# Patient Record
Sex: Female | Born: 1970
Health system: Southern US, Community
[De-identification: ages and names within clinical notes are randomized; demographics above are authoritative.]

## PROBLEM LIST (undated history)

## (undated) DIAGNOSIS — Z9852 Vasectomy status: Secondary | ICD-10-CM

## (undated) DIAGNOSIS — N200 Calculus of kidney: Secondary | ICD-10-CM

## (undated) HISTORY — DX: Calculus of kidney: N20.0

## (undated) HISTORY — DX: Vasectomy status: Z98.52

---

## 1991-08-30 HISTORY — PX: WISDOM TOOTH EXTRACTION: SHX21

## 1999-02-16 ENCOUNTER — Other Ambulatory Visit: Admission: RE | Admit: 1999-02-16 | Discharge: 1999-02-16 | Payer: Self-pay | Admitting: Gynecology

## 2000-03-09 ENCOUNTER — Other Ambulatory Visit: Admission: RE | Admit: 2000-03-09 | Discharge: 2000-03-09 | Payer: Self-pay | Admitting: Obstetrics and Gynecology

## 2001-04-13 ENCOUNTER — Other Ambulatory Visit: Admission: RE | Admit: 2001-04-13 | Discharge: 2001-04-13 | Payer: Self-pay | Admitting: Gynecology

## 2002-06-14 ENCOUNTER — Other Ambulatory Visit: Admission: RE | Admit: 2002-06-14 | Discharge: 2002-06-14 | Payer: Self-pay | Admitting: Gynecology

## 2003-07-18 ENCOUNTER — Other Ambulatory Visit: Admission: RE | Admit: 2003-07-18 | Discharge: 2003-07-18 | Payer: Self-pay | Admitting: Gynecology

## 2004-05-26 ENCOUNTER — Emergency Department (HOSPITAL_COMMUNITY): Admission: EM | Admit: 2004-05-26 | Discharge: 2004-05-27 | Payer: Self-pay | Admitting: Emergency Medicine

## 2004-07-19 ENCOUNTER — Other Ambulatory Visit: Admission: RE | Admit: 2004-07-19 | Discharge: 2004-07-19 | Payer: Self-pay | Admitting: Gynecology

## 2005-07-25 ENCOUNTER — Other Ambulatory Visit: Admission: RE | Admit: 2005-07-25 | Discharge: 2005-07-25 | Payer: Self-pay | Admitting: Gynecology

## 2006-07-28 ENCOUNTER — Other Ambulatory Visit: Admission: RE | Admit: 2006-07-28 | Discharge: 2006-07-28 | Payer: Self-pay | Admitting: Gynecology

## 2006-10-06 ENCOUNTER — Encounter: Admission: RE | Admit: 2006-10-06 | Discharge: 2006-10-06 | Payer: Self-pay | Admitting: Gynecology

## 2006-10-27 ENCOUNTER — Encounter: Admission: RE | Admit: 2006-10-27 | Discharge: 2006-10-27 | Payer: Self-pay | Admitting: Gynecology

## 2007-08-03 ENCOUNTER — Other Ambulatory Visit: Admission: RE | Admit: 2007-08-03 | Discharge: 2007-08-03 | Payer: Self-pay | Admitting: Gynecology

## 2008-08-11 ENCOUNTER — Ambulatory Visit: Payer: Self-pay | Admitting: Women's Health

## 2008-08-11 ENCOUNTER — Encounter: Payer: Self-pay | Admitting: Women's Health

## 2008-08-11 ENCOUNTER — Other Ambulatory Visit: Admission: RE | Admit: 2008-08-11 | Discharge: 2008-08-11 | Payer: Self-pay | Admitting: Gynecology

## 2009-09-03 ENCOUNTER — Ambulatory Visit: Payer: Self-pay | Admitting: Women's Health

## 2009-09-03 ENCOUNTER — Other Ambulatory Visit: Admission: RE | Admit: 2009-09-03 | Discharge: 2009-09-03 | Payer: Self-pay | Admitting: Gynecology

## 2010-09-08 ENCOUNTER — Other Ambulatory Visit
Admission: RE | Admit: 2010-09-08 | Discharge: 2010-09-08 | Payer: Self-pay | Source: Home / Self Care | Admitting: Gynecology

## 2010-09-08 ENCOUNTER — Ambulatory Visit
Admission: RE | Admit: 2010-09-08 | Discharge: 2010-09-08 | Payer: Self-pay | Source: Home / Self Care | Attending: Women's Health | Admitting: Women's Health

## 2010-09-19 ENCOUNTER — Encounter: Payer: Self-pay | Admitting: Gynecology

## 2010-09-21 ENCOUNTER — Other Ambulatory Visit: Payer: Self-pay | Admitting: Obstetrics and Gynecology

## 2010-09-21 DIAGNOSIS — Z1239 Encounter for other screening for malignant neoplasm of breast: Secondary | ICD-10-CM

## 2010-10-05 ENCOUNTER — Ambulatory Visit
Admission: RE | Admit: 2010-10-05 | Discharge: 2010-10-05 | Disposition: A | Discharge status: Home / Self Care | Payer: BC Managed Care – PPO | Source: Ambulatory Visit | Attending: Obstetrics and Gynecology | Admitting: Obstetrics and Gynecology

## 2010-10-05 DIAGNOSIS — Z1239 Encounter for other screening for malignant neoplasm of breast: Secondary | ICD-10-CM

## 2011-09-06 ENCOUNTER — Other Ambulatory Visit: Payer: Self-pay | Admitting: Obstetrics and Gynecology

## 2011-09-06 DIAGNOSIS — Z1231 Encounter for screening mammogram for malignant neoplasm of breast: Secondary | ICD-10-CM

## 2011-09-13 DIAGNOSIS — Z9852 Vasectomy status: Secondary | ICD-10-CM | POA: Insufficient documentation

## 2011-09-13 DIAGNOSIS — N200 Calculus of kidney: Secondary | ICD-10-CM | POA: Insufficient documentation

## 2011-09-19 ENCOUNTER — Encounter: Payer: BC Managed Care – PPO | Admitting: Women's Health

## 2011-09-30 ENCOUNTER — Encounter: Payer: BC Managed Care – PPO | Admitting: Women's Health

## 2011-10-10 ENCOUNTER — Ambulatory Visit
Admission: RE | Admit: 2011-10-10 | Discharge: 2011-10-10 | Disposition: A | Discharge status: Home / Self Care | Payer: BC Managed Care – PPO | Source: Ambulatory Visit | Attending: Obstetrics and Gynecology | Admitting: Obstetrics and Gynecology

## 2011-10-10 DIAGNOSIS — Z1231 Encounter for screening mammogram for malignant neoplasm of breast: Secondary | ICD-10-CM

## 2011-10-24 ENCOUNTER — Encounter: Payer: BC Managed Care – PPO | Admitting: Women's Health

## 2011-11-02 ENCOUNTER — Encounter: Payer: BC Managed Care – PPO | Admitting: Women's Health

## 2011-11-21 ENCOUNTER — Encounter: Payer: BC Managed Care – PPO | Admitting: Women's Health

## 2011-12-02 ENCOUNTER — Encounter: Payer: BC Managed Care – PPO | Admitting: Women's Health

## 2011-12-07 ENCOUNTER — Encounter: Payer: Self-pay | Admitting: Women's Health

## 2011-12-07 ENCOUNTER — Ambulatory Visit (INDEPENDENT_AMBULATORY_CARE_PROVIDER_SITE_OTHER): Payer: BC Managed Care – PPO | Admitting: Women's Health

## 2011-12-07 VITALS — BP 108/70 | Ht 65.0 in | Wt 176.0 lb

## 2011-12-07 DIAGNOSIS — Z833 Family history of diabetes mellitus: Secondary | ICD-10-CM

## 2011-12-07 DIAGNOSIS — Z01419 Encounter for gynecological examination (general) (routine) without abnormal findings: Secondary | ICD-10-CM

## 2011-12-07 NOTE — Patient Instructions (Signed)

## 2011-12-07 NOTE — Progress Notes (Signed)
Angelica Holt 11/21/1970 [id number]    History:    The patient presents for annual exam.  Monthly 6 day cycles/vasectomy with no complaints. History of all normal Paps and mammograms.   Past medical history, past surgical history, family history and social history were all reviewed and documented in the EPIC chart. 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   ROS:  A  ROS was performed and pertinent positives and negatives are included in the history.  Exam:  Filed Vitals:   12/07/11 1600  BP: 108/70    General appearance:  Normal Head/Neck:  Normal, without cervical or supraclavicular adenopathy. Thyroid:  Symmetrical, normal in size, without palpable masses or nodularity. Respiratory  Effort:  Normal  Auscultation:  Clear without wheezing or rhonchi Cardiovascular  Auscultation:  Regular rate, without rubs, murmurs or gallops  Edema/varicosities:  Not grossly evident Abdominal  Soft,nontender, without masses, guarding or rebound.  Liver/spleen:  No organomegaly noted  Hernia:  None appreciated  Skin  Inspection:  Grossly normal  Palpation:  Grossly normal Neurologic/psychiatric  Orientation:  Normal with appropriate conversation.  Mood/affect:  Normal  Genitourinary    Breasts: Examined lying and sitting.     Right: Without masses, retractions, discharge or axillary adenopathy.     Left: Without masses, retractions, discharge or axillary adenopathy.   Inguinal/mons:  Normal without inguinal adenopathy  External genitalia:  Normal  BUS/Urethra/Skene's glands:  Normal  Bladder:  Normal  Vagina:  Normal  Cervix:  Normal  Uterus:   normal in size, shape and contour.  Midline and mobile  Adnexa/parametria:     Rt: Without masses or tenderness.   Lt: Without masses or tenderness.  Anus and perineum: Normal  Digital rectal exam: Normal sphincter tone without palpated masses or tenderness  Assessment/Plan:  41 y.o. M. WF G2 P2 for annual exam  with no complaints.  Normal GYN exam  Plan: SBE's, continue annual mammogram, calcium rich diet, continue regular exercise encouraged. Had a normal lipid profile last year instructed to return to the office next year fasting and will repeat. CBC, glucose, UA. Reviewed current ACOG's pap recommendations. We'll repeat Pap next year.  [name] [facility], 5:04 PM 12/07/2011

## 2011-12-08 LAB — CBC WITH DIFFERENTIAL/PLATELET
HCT: 37.5 % (ref 36.0–46.0)
Hemoglobin: 12.3 g/dL (ref 12.0–15.0)
MCH: 29.2 pg (ref 26.0–34.0)
MCHC: 32.8 g/dL (ref 30.0–36.0)
Neutrophils Relative %: 55 % (ref 43–77)
Platelets: 219 10*3/uL (ref 150–400)
RBC: 4.21 MIL/uL (ref 3.87–5.11)
RDW: 12.8 % (ref 11.5–15.5)
WBC: 6 10*3/uL (ref 4.0–10.5)

## 2011-12-08 LAB — URINALYSIS W MICROSCOPIC + REFLEX CULTURE
Bacteria, UA: NONE SEEN
Casts: NONE SEEN
Leukocytes, UA: NEGATIVE
Squamous Epithelial / LPF: NONE SEEN

## 2011-12-08 LAB — GLUCOSE, RANDOM: Glucose, Bld: 81 mg/dL (ref 70–99)

## 2012-09-11 ENCOUNTER — Other Ambulatory Visit: Payer: Self-pay | Admitting: Gynecology

## 2012-09-11 DIAGNOSIS — Z1231 Encounter for screening mammogram for malignant neoplasm of breast: Secondary | ICD-10-CM

## 2012-10-19 ENCOUNTER — Ambulatory Visit
Admission: RE | Admit: 2012-10-19 | Discharge: 2012-10-19 | Disposition: A | Discharge status: Home / Self Care | Payer: BC Managed Care – PPO | Source: Ambulatory Visit | Attending: Gynecology | Admitting: Gynecology

## 2012-10-19 DIAGNOSIS — Z1231 Encounter for screening mammogram for malignant neoplasm of breast: Secondary | ICD-10-CM

## 2012-12-19 ENCOUNTER — Encounter: Payer: Self-pay | Admitting: Women's Health

## 2012-12-19 ENCOUNTER — Other Ambulatory Visit (HOSPITAL_COMMUNITY)
Admission: RE | Admit: 2012-12-19 | Discharge: 2012-12-19 | Disposition: A | Discharge status: Home / Self Care | Payer: BC Managed Care – PPO | Source: Ambulatory Visit | Attending: Obstetrics and Gynecology | Admitting: Obstetrics and Gynecology

## 2012-12-19 ENCOUNTER — Ambulatory Visit (INDEPENDENT_AMBULATORY_CARE_PROVIDER_SITE_OTHER): Payer: BC Managed Care – PPO | Admitting: Women's Health

## 2012-12-19 VITALS — BP 120/70 | Ht 65.0 in | Wt 178.0 lb

## 2012-12-19 DIAGNOSIS — Z01419 Encounter for gynecological examination (general) (routine) without abnormal findings: Secondary | ICD-10-CM

## 2012-12-19 DIAGNOSIS — Z13220 Encounter for screening for lipoid disorders: Secondary | ICD-10-CM

## 2012-12-19 DIAGNOSIS — Z833 Family history of diabetes mellitus: Secondary | ICD-10-CM

## 2012-12-19 LAB — LIPID PANEL
Cholesterol: 197 mg/dL (ref 0–200)
HDL: 55 mg/dL (ref >39)
LDL Cholesterol: 130 mg/dL — ABNORMAL HIGH (ref 0–99)
Triglycerides: 62 mg/dL (ref <150)
VLDL: 12 mg/dL (ref 0–40)

## 2012-12-19 LAB — CBC WITH DIFFERENTIAL/PLATELET
Basophils Absolute: 0 10*3/uL (ref 0.0–0.1)
Hemoglobin: 12.4 g/dL (ref 12.0–15.0)
MCHC: 33 g/dL (ref 30.0–36.0)
MCV: 87.9 fL (ref 78.0–100.0)
Neutro Abs: 3.8 10*3/uL (ref 1.7–7.7)
Neutrophils Relative %: 66 % (ref 43–77)
Platelets: 233 10*3/uL (ref 150–400)
RBC: 4.28 MIL/uL (ref 3.87–5.11)
RDW: 13.5 % (ref 11.5–15.5)

## 2012-12-19 LAB — GLUCOSE, RANDOM: Glucose, Bld: 99 mg/dL (ref 70–99)

## 2012-12-19 NOTE — Progress Notes (Signed)
Angelica Holt 1971-01-06 [id number]    History:    The patient presents for annual exam.  Regular monthly cycles/vasectomy. Normal Pap and mammogram history.   Past medical history, past surgical history, family history and social history were all reviewed and documented in the EPIC chart. 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 History of kidney stone 2005.   ROS:  A  ROS was performed and pertinent positives and negatives are included in the history.  Exam:  Filed Vitals:   12/19/12 1402  BP: 120/70    General appearance:  Normal Head/Neck:  Normal, without cervical or supraclavicular adenopathy. Thyroid:  Symmetrical, normal in size, without palpable masses or nodularity. Respiratory  Effort:  Normal  Auscultation:  Clear without wheezing or rhonchi Cardiovascular  Auscultation:  Regular rate, without rubs, murmurs or gallops  Edema/varicosities:  Not grossly evident Abdominal  Soft,nontender, without masses, guarding or rebound.  Liver/spleen:  No organomegaly noted  Hernia:  None appreciated  Skin  Inspection:  Grossly normal  Palpation:  Grossly normal Neurologic/psychiatric  Orientation:  Normal with appropriate conversation.  Mood/affect:  Normal  Genitourinary    Breasts: Examined lying and sitting.     Right: Without masses, retractions, discharge or axillary adenopathy.     Left: Without masses, retractions, discharge or axillary adenopathy.   Inguinal/mons:  Normal without inguinal adenopathy  External genitalia:  Normal  BUS/Urethra/Skene's glands:  Normal  Bladder:  Normal  Vagina:  Normal  Cervix:  Normal  Uterus:   normal in size, shape and contour.  Midline and mobile  Adnexa/parametria:     Rt: Without masses or tenderness.   Lt: Without masses or tenderness.  Anus and perineum: Normal  Digital rectal exam: Normal sphincter tone without palpated masses or  tenderness  Assessment/Plan:  42 y.o. M. WF G2 P2 for annual exam with no complaints.    Normal GYN exam/vasectomy  Plan: SBE's, continue annual mammogram, increase regular exercise, decrease calories for weight loss, calcium rich diet, vitamin D 1000 daily. CBC, glucose, lipid panel, UA, Pap. Pap normal 2012, new screening guidelines reviewed.   [name] [facility], 5:48 PM 12/19/2012

## 2012-12-19 NOTE — Patient Instructions (Signed)

## 2012-12-20 ENCOUNTER — Encounter: Payer: Self-pay | Admitting: Women's Health

## 2012-12-20 LAB — URINALYSIS W MICROSCOPIC + REFLEX CULTURE
Bilirubin Urine: NEGATIVE
Glucose, UA: NEGATIVE mg/dL
Ketones, ur: NEGATIVE mg/dL
Leukocytes, UA: NEGATIVE
Protein, ur: NEGATIVE mg/dL
Squamous Epithelial / LPF: NONE SEEN
pH: 5 (ref 5.0–8.0)

## 2013-09-16 ENCOUNTER — Other Ambulatory Visit: Payer: Self-pay

## 2013-09-16 DIAGNOSIS — Z1231 Encounter for screening mammogram for malignant neoplasm of breast: Secondary | ICD-10-CM

## 2013-11-01 ENCOUNTER — Ambulatory Visit
Admission: RE | Admit: 2013-11-01 | Discharge: 2013-11-01 | Disposition: A | Discharge status: Home / Self Care | Payer: Self-pay | Source: Ambulatory Visit

## 2013-11-01 DIAGNOSIS — Z1231 Encounter for screening mammogram for malignant neoplasm of breast: Secondary | ICD-10-CM

## 2013-12-20 ENCOUNTER — Ambulatory Visit (INDEPENDENT_AMBULATORY_CARE_PROVIDER_SITE_OTHER): Payer: BC Managed Care – PPO | Admitting: Women's Health

## 2013-12-20 ENCOUNTER — Encounter: Payer: Self-pay | Admitting: Women's Health

## 2013-12-20 VITALS — BP 112/70 | Ht 65.0 in | Wt 193.0 lb

## 2013-12-20 DIAGNOSIS — E079 Disorder of thyroid, unspecified: Secondary | ICD-10-CM

## 2013-12-20 DIAGNOSIS — Z01419 Encounter for gynecological examination (general) (routine) without abnormal findings: Secondary | ICD-10-CM

## 2013-12-20 DIAGNOSIS — Z833 Family history of diabetes mellitus: Secondary | ICD-10-CM

## 2013-12-20 LAB — CBC WITH DIFFERENTIAL/PLATELET
BASOS ABS: 0 10*3/uL (ref 0.0–0.1)
BASOS PCT: 1 % (ref 0–1)
Eosinophils Absolute: 0.1 10*3/uL (ref 0.0–0.7)
Eosinophils Relative: 2 % (ref 0–5)
HEMATOCRIT: 38.7 % (ref 36.0–46.0)
Hemoglobin: 13.2 g/dL (ref 12.0–15.0)
LYMPHS ABS: 1.2 10*3/uL (ref 0.7–4.0)
Lymphocytes Relative: 32 % (ref 12–46)
MCH: 28.8 pg (ref 26.0–34.0)
MCHC: 34.1 g/dL (ref 30.0–36.0)
MCV: 84.5 fL (ref 78.0–100.0)
Monocytes Absolute: 0.3 10*3/uL (ref 0.1–1.0)
Monocytes Relative: 7 % (ref 3–12)
NEUTROS ABS: 2.3 10*3/uL (ref 1.7–7.7)
NEUTROS PCT: 58 % (ref 43–77)
PLATELETS: 249 10*3/uL (ref 150–400)
RBC: 4.58 MIL/uL (ref 3.87–5.11)
RDW: 13.4 % (ref 11.5–15.5)
WBC: 3.9 10*3/uL — AB (ref 4.0–10.5)

## 2013-12-20 LAB — URINALYSIS W MICROSCOPIC + REFLEX CULTURE
BILIRUBIN URINE: NEGATIVE
CASTS: NONE SEEN
Crystals: NONE SEEN
Glucose, UA: NEGATIVE mg/dL
HGB URINE DIPSTICK: NEGATIVE
KETONES UR: NEGATIVE mg/dL
LEUKOCYTES UA: NEGATIVE
Nitrite: NEGATIVE
PH: 6 (ref 5.0–8.0)
Protein, ur: NEGATIVE mg/dL
SPECIFIC GRAVITY, URINE: 1.024 (ref 1.005–1.030)
UROBILINOGEN UA: 0.2 mg/dL (ref 0.0–1.0)

## 2013-12-20 LAB — GLUCOSE, RANDOM: GLUCOSE: 83 mg/dL (ref 70–99)

## 2013-12-20 LAB — TSH: TSH: 1.559 u[IU]/mL (ref 0.350–4.500)

## 2013-12-20 NOTE — Patient Instructions (Signed)
Calorie Counting Diet A calorie counting diet requires you to eat the number of calories that are right for you in a day. Calories are the measurement of how much energy you get from the food you eat. Eating the right amount of calories is important for staying at a healthy weight. If you eat too many calories, your body will store them as fat and you may gain weight. If you eat too few calories, you may lose weight. Counting the number of calories you eat during a day will help you know if you are eating the right amount. A Registered Dietitian can determine how many calories you need in a day. The amount of calories needed varies from person to person. If your goal is to lose weight, you will need to eat fewer calories. Losing weight can benefit you if you are overweight or have health problems such as heart disease, high blood pressure, or diabetes. If your goal is to gain weight, you will need to eat more calories. Gaining weight may be necessary if you have a certain health problem that causes your body to need more energy. TIPS Whether you are increasing or decreasing the number of calories you eat during a day, it may be hard to get used to changes in what you eat and drink. The following are tips to help you keep track of the number of calories you eat.  Measure foods at home with measuring cups. This helps you know the amount of food and number of calories you are eating.  Restaurants often serve food in amounts that are larger than 1 serving. While eating out, estimate how many servings of a food you are given. For example, a serving of cooked rice is  cup or about the size of half of a fist. Knowing serving sizes will help you be aware of how much food you are eating at restaurants.  Ask for smaller portion sizes or child-size portions at restaurants.  Plan to eat half of a meal at a restaurant. Take the rest home or share the other half with a friend.  Read the Nutrition Facts panel on  food labels for calorie content and serving size. You can find out how many servings are in a package, the size of a serving, and the number of calories each serving has.  For example, a package might contain 3 cookies. The Nutrition Facts panel on that package says that 1 serving is 1 cookie. Below that, it will say there are 3 servings in the container. The calories section of the Nutrition Facts label says there are 90 calories. This means there are 90 calories in 1 cookie (1 serving). If you eat 1 cookie you have eaten 90 calories. If you eat all 3 cookies, you have eaten 270 calories (3 servings x 90 calories = 270 calories). The list below tells you how big or small some common portion sizes are.  1 oz.........4 stacked dice.  3 oz.........Deck of cards.  1 tsp........Tip of little finger.  1 tbs........Thumb.  2 tbs........Golf ball.   cup.......Half of a fist.  1 cup........A fist. KEEP A FOOD LOG Write down every food item you eat, the amount you eat, and the number of calories in each food you eat during the day. At the end of the day, you can add up the total number of calories you have eaten. It may help to keep a list like the one below. Find out the calorie information by reading the   Nutrition Facts panel on food labels. Breakfast  Bran cereal (1 cup, 110 calories).  Fat-free milk ( cup, 45 calories). Snack  Apple (1 medium, 80 calories). Lunch  Spinach (1 cup, 20 calories).  Tomato ( medium, 20 calories).  Chicken breast strips (3 oz, 165 calories).  Shredded cheddar cheese ( cup, 110 calories).  Light Italian dressing (2 tbs, 60 calories).  Whole-wheat bread (1 slice, 80 calories).  Tub margarine (1 tsp, 35 calories).  Vegetable soup (1 cup, 160 calories). Dinner  Pork chop (3 oz, 190 calories).  Brown rice (1 cup, 215 calories).  Steamed broccoli ( cup, 20 calories).  Strawberries (1  cup, 65 calories).  Whipped cream (1 tbs, 50  calories). Daily Calorie Total: 1425 Document Released: 08/15/2005 Document Revised: 11/07/2011 Document Reviewed: 02/09/2007 Exit[employer] Patient Information 2014 [employer].  

## 2013-12-20 NOTE — Progress Notes (Signed)
Kern AlbertaSharon K Moring 08/21/1971 [id number]    History:    Presents for annual exam. Regular monthly cycles/vasectomy. Normal Pap and mammogram history. Recent 15 pound weight gain, has started Zumba to decrease weight.   Past medical history, past surgical history, family history and social history were all reviewed and documented in the EPIC chart. 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 History of kidney stone 2005.  ROS:  A  ROS was performed and pertinent positives and negatives are included.  Exam:  Filed Vitals:   12/20/13 0932  BP: 112/70    General appearance:  Normal Thyroid:  Symmetrical, normal in size, without palpable masses or nodularity. Respiratory  Auscultation:  Clear without wheezing or rhonchi Cardiovascular  Auscultation:  Regular rate, without rubs, murmurs or gallops  Edema/varicosities:  Not grossly evident Abdominal  Soft,nontender, without masses, guarding or rebound.  Liver/spleen:  No organomegaly noted  Hernia:  None appreciated  Skin  Inspection:  Grossly normal   Breasts: Examined lying and sitting.     Right: Without masses, retractions, discharge or axillary adenopathy.     Left: Without masses, retractions, discharge or axillary adenopathy. Gentitourinary   Inguinal/mons:  Normal without inguinal adenopathy  External genitalia:  Normal  BUS/Urethra/Skene's glands:  Normal  Vagina:  Normal  Cervix:  Normal  Uterus:  normal in size, shape and contour.  Midline and mobile  Adnexa/parametria:     Rt: Without masses or tenderness.   Lt: Without masses or tenderness.  Anus and perineum: Normal  Digital rectal exam: Normal sphincter tone without palpated masses or tenderness  Assessment/Plan:  43 y.o.  M WF G2P2 for annual exam with no complaints.  Normal GYN exam/vasectomy   Plan: SBE's, continue annual mammogram, increase regular exercise, decrease calories for weight loss,  calcium rich diet, vitamin D 1000 daily. CBC, glucose, UA, TSH. Pap normal 2014, new screening guidelines reviewed.  Harrington Challengerancy J Coleston Dirosa [facility] HospitalWHNP, 10:09 AM 12/20/2013

## 2014-06-30 ENCOUNTER — Encounter: Payer: Self-pay | Admitting: Women's Health

## 2014-10-21 ENCOUNTER — Other Ambulatory Visit: Payer: Self-pay

## 2014-10-21 DIAGNOSIS — Z1231 Encounter for screening mammogram for malignant neoplasm of breast: Secondary | ICD-10-CM

## 2014-11-19 ENCOUNTER — Ambulatory Visit
Admission: RE | Admit: 2014-11-19 | Discharge: 2014-11-19 | Disposition: A | Discharge status: Home / Self Care | Payer: BLUE CROSS/BLUE SHIELD | Source: Ambulatory Visit

## 2014-11-19 ENCOUNTER — Other Ambulatory Visit: Payer: Self-pay

## 2014-11-19 DIAGNOSIS — Z1231 Encounter for screening mammogram for malignant neoplasm of breast: Secondary | ICD-10-CM

## 2014-11-25 ENCOUNTER — Ambulatory Visit: Payer: Self-pay

## 2014-12-23 ENCOUNTER — Ambulatory Visit (INDEPENDENT_AMBULATORY_CARE_PROVIDER_SITE_OTHER): Payer: BLUE CROSS/BLUE SHIELD | Admitting: Women's Health

## 2014-12-23 ENCOUNTER — Other Ambulatory Visit (HOSPITAL_COMMUNITY)
Admission: RE | Admit: 2014-12-23 | Discharge: 2014-12-23 | Disposition: A | Discharge status: Home / Self Care | Payer: BLUE CROSS/BLUE SHIELD | Source: Ambulatory Visit | Attending: Gynecology | Admitting: Gynecology

## 2014-12-23 ENCOUNTER — Encounter: Payer: Self-pay | Admitting: Women's Health

## 2014-12-23 VITALS — BP 128/70 | Ht 65.0 in | Wt 188.0 lb

## 2014-12-23 DIAGNOSIS — Z833 Family history of diabetes mellitus: Secondary | ICD-10-CM

## 2014-12-23 DIAGNOSIS — N200 Calculus of kidney: Secondary | ICD-10-CM

## 2014-12-23 DIAGNOSIS — Z13220 Encounter for screening for lipoid disorders: Secondary | ICD-10-CM

## 2014-12-23 DIAGNOSIS — Z01419 Encounter for gynecological examination (general) (routine) without abnormal findings: Secondary | ICD-10-CM | POA: Diagnosis not present

## 2014-12-23 DIAGNOSIS — Z9852 Vasectomy status: Secondary | ICD-10-CM

## 2014-12-23 LAB — CBC WITH DIFFERENTIAL/PLATELET
BASOS ABS: 0 10*3/uL (ref 0.0–0.1)
Basophils Relative: 0 % (ref 0–1)
Eosinophils Absolute: 0.1 10*3/uL (ref 0.0–0.7)
Eosinophils Relative: 1 % (ref 0–5)
HCT: 38.8 % (ref 36.0–46.0)
HEMOGLOBIN: 12.8 g/dL (ref 12.0–15.0)
LYMPHS ABS: 1.7 10*3/uL (ref 0.7–4.0)
Lymphocytes Relative: 25 % (ref 12–46)
MCH: 29.4 pg (ref 26.0–34.0)
MCHC: 33 g/dL (ref 30.0–36.0)
MCV: 89 fL (ref 78.0–100.0)
MONO ABS: 0.5 10*3/uL (ref 0.1–1.0)
MPV: 10.4 fL (ref 8.6–12.4)
Monocytes Relative: 8 % (ref 3–12)
NEUTROS ABS: 4.4 10*3/uL (ref 1.7–7.7)
Neutrophils Relative %: 66 % (ref 43–77)
Platelets: 243 10*3/uL (ref 150–400)
RBC: 4.36 MIL/uL (ref 3.87–5.11)
RDW: 13.8 % (ref 11.5–15.5)
WBC: 6.7 10*3/uL (ref 4.0–10.5)

## 2014-12-23 LAB — LIPID PANEL
Cholesterol: 194 mg/dL (ref 0–200)
HDL: 60 mg/dL (ref >=46)
LDL Cholesterol: 115 mg/dL — ABNORMAL HIGH (ref 0–99)
Total CHOL/HDL Ratio: 3.2 Ratio
Triglycerides: 96 mg/dL (ref <150)
VLDL: 19 mg/dL (ref 0–40)

## 2014-12-23 LAB — GLUCOSE, RANDOM: GLUCOSE: 104 mg/dL — AB (ref 70–99)

## 2014-12-23 NOTE — Progress Notes (Signed)
Angelica Holt 01/26/1971 [id number]    History:    Presents for annual exam.  Monthly cycles/vasectomy. Normal Pap and mammogram history.  Past medical history, past surgical history, family history and social history were all reviewed and documented in the EPIC chart. 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 Both received gardasil. Mother having dementia problems, father heart disease.  ROS:  A ROS was performed and pertinent positives and negatives are included.  Exam:  Filed Vitals:   12/23/14 1405  BP: 128/70    General appearance:  Normal Thyroid:  Symmetrical, normal in size, without palpable masses or nodularity. Respiratory  Auscultation:  Clear without wheezing or rhonchi Cardiovascular  Auscultation:  Regular rate, without rubs, murmurs or gallops  Edema/varicosities:  Not grossly evident Abdominal  Soft,nontender, without masses, guarding or rebound.  Liver/spleen:  No organomegaly noted  Hernia:  None appreciated  Skin  Inspection:  Grossly normal   Breasts: Examined lying and sitting.     Right: Without masses, retractions, discharge or axillary adenopathy.     Left: Without masses, retractions, discharge or axillary adenopathy. Gentitourinary   Inguinal/mons:  Normal without inguinal adenopathy  External genitalia:  Normal  BUS/Urethra/Skene's glands:  Normal  Vagina:  Normal  Cervix:  Normal  Uterus:   normal in size, shape and contour.  Midline and mobile  Adnexa/parametria:     Rt: Without masses or tenderness.   Lt: Without masses or tenderness.  Anus and perineum: Normal  Digital rectal exam: Normal sphincter tone without palpated masses or tenderness  Assessment/Plan:  44 y.o. MWF G2P2 for annual exam with no complaints.  Monthly cycle/vasectomy  Plan: SBE's, continue annual screening mammogram, calcium rich diet, vitamin D 1000 daily encouraged. Continue regular exercise/zumba. CBC, lipid panel, CMP, UA,  Pap normal 2014, Pap with HR HPV typing, new screening guidelines reviewed.  Harrington ChallengerYOUNG,Caylen Kuwahara J [facility] CenterWHNP, 5:03 PM 12/23/2014

## 2014-12-23 NOTE — Patient Instructions (Signed)

## 2014-12-24 LAB — URINALYSIS W MICROSCOPIC + REFLEX CULTURE
BACTERIA UA: NONE SEEN
BILIRUBIN URINE: NEGATIVE
Casts: NONE SEEN
Crystals: NONE SEEN
Glucose, UA: NEGATIVE mg/dL
HGB URINE DIPSTICK: NEGATIVE
KETONES UR: NEGATIVE mg/dL
Leukocytes, UA: NEGATIVE
Nitrite: NEGATIVE
PH: 6 (ref 5.0–8.0)
PROTEIN: NEGATIVE mg/dL
SPECIFIC GRAVITY, URINE: 1.03 (ref 1.005–1.030)
UROBILINOGEN UA: 0.2 mg/dL (ref 0.0–1.0)

## 2014-12-26 LAB — CYTOLOGY - PAP

## 2015-10-19 ENCOUNTER — Other Ambulatory Visit: Payer: Self-pay

## 2015-10-19 DIAGNOSIS — Z1231 Encounter for screening mammogram for malignant neoplasm of breast: Secondary | ICD-10-CM

## 2015-11-25 ENCOUNTER — Ambulatory Visit: Payer: BLUE CROSS/BLUE SHIELD

## 2015-12-02 ENCOUNTER — Ambulatory Visit
Admission: RE | Admit: 2015-12-02 | Discharge: 2015-12-02 | Disposition: A | Discharge status: Home / Self Care | Payer: BLUE CROSS/BLUE SHIELD | Source: Ambulatory Visit

## 2015-12-02 DIAGNOSIS — Z1231 Encounter for screening mammogram for malignant neoplasm of breast: Secondary | ICD-10-CM | POA: Diagnosis not present

## 2015-12-29 ENCOUNTER — Encounter: Payer: BLUE CROSS/BLUE SHIELD | Admitting: Women's Health

## 2016-02-16 ENCOUNTER — Encounter: Payer: Self-pay | Admitting: Women's Health

## 2016-02-16 ENCOUNTER — Ambulatory Visit (INDEPENDENT_AMBULATORY_CARE_PROVIDER_SITE_OTHER): Payer: BLUE CROSS/BLUE SHIELD | Admitting: Women's Health

## 2016-02-16 VITALS — BP 126/78 | Ht 65.0 in | Wt 199.0 lb

## 2016-02-16 DIAGNOSIS — Z01419 Encounter for gynecological examination (general) (routine) without abnormal findings: Secondary | ICD-10-CM

## 2016-02-16 DIAGNOSIS — Z13220 Encounter for screening for lipoid disorders: Secondary | ICD-10-CM | POA: Diagnosis not present

## 2016-02-16 LAB — LIPID PANEL
CHOL/HDL RATIO: 3.1 ratio (ref <=5.0)
CHOLESTEROL: 216 mg/dL — AB (ref 125–200)
HDL: 69 mg/dL (ref >=46)
LDL Cholesterol: 134 mg/dL — ABNORMAL HIGH (ref <130)
TRIGLYCERIDES: 64 mg/dL (ref <150)
VLDL: 13 mg/dL (ref <30)

## 2016-02-16 LAB — CBC WITH DIFFERENTIAL/PLATELET
BASOS ABS: 0 {cells}/uL (ref 0–200)
BASOS PCT: 0 %
EOS ABS: 94 {cells}/uL (ref 15–500)
Eosinophils Relative: 2 %
HCT: 40.5 % (ref 35.0–45.0)
HEMOGLOBIN: 13.5 g/dL (ref 11.7–15.5)
LYMPHS ABS: 1551 {cells}/uL (ref 850–3900)
Lymphocytes Relative: 33 %
MCH: 29.6 pg (ref 27.0–33.0)
MCHC: 33.3 g/dL (ref 32.0–36.0)
MCV: 88.8 fL (ref 80.0–100.0)
MONOS PCT: 8 %
MPV: 10.5 fL (ref 7.5–12.5)
Monocytes Absolute: 376 cells/uL (ref 200–950)
NEUTROS ABS: 2679 {cells}/uL (ref 1500–7800)
Neutrophils Relative %: 57 %
PLATELETS: 239 10*3/uL (ref 140–400)
RBC: 4.56 MIL/uL (ref 3.80–5.10)
RDW: 13.4 % (ref 11.0–15.0)
WBC: 4.7 10*3/uL (ref 3.8–10.8)

## 2016-02-16 LAB — COMPREHENSIVE METABOLIC PANEL
ALBUMIN: 3.9 g/dL (ref 3.6–5.1)
ALK PHOS: 73 U/L (ref 33–115)
ALT: 14 U/L (ref 6–29)
AST: 17 U/L (ref 10–35)
BUN: 10 mg/dL (ref 7–25)
CALCIUM: 8.7 mg/dL (ref 8.6–10.2)
CO2: 22 mmol/L (ref 20–31)
Chloride: 105 mmol/L (ref 98–110)
Creat: 0.65 mg/dL (ref 0.50–1.10)
GLUCOSE: 88 mg/dL (ref 65–99)
POTASSIUM: 4.1 mmol/L (ref 3.5–5.3)
Sodium: 139 mmol/L (ref 135–146)
Total Bilirubin: 0.5 mg/dL (ref 0.2–1.2)
Total Protein: 6.4 g/dL (ref 6.1–8.1)

## 2016-02-16 NOTE — Patient Instructions (Signed)
Health Maintenance, Female Adopting a healthy lifestyle and getting preventive care can go a long way to promote health and wellness. Talk with your health care provider about what schedule of regular examinations is right for you. This is a good chance for you to check in with your provider about disease prevention and staying healthy. In between checkups, there are plenty of things you can do on your own. Experts have done a lot of research about which lifestyle changes and preventive measures are most likely to keep you healthy. Ask your health care provider for more information. WEIGHT AND DIET  Eat a healthy diet  Be sure to include plenty of vegetables, fruits, low-fat dairy products, and lean protein.  Do not eat a lot of foods high in solid fats, added sugars, or salt.  Get regular exercise. This is one of the most important things you can do for your health.  Most adults should exercise for at least 150 minutes each week. The exercise should increase your heart rate and make you sweat (moderate-intensity exercise).  Most adults should also do strengthening exercises at least twice a week. This is in addition to the moderate-intensity exercise.  Maintain a healthy weight  Body mass index (BMI) is a measurement that can be used to identify possible weight problems. It estimates body fat based on height and weight. Your health care provider can help determine your BMI and help you achieve or maintain a healthy weight.  For females 20 years of age and older:   A BMI below 18.5 is considered underweight.  A BMI of 18.5 to 24.9 is normal.  A BMI of 25 to 29.9 is considered overweight.  A BMI of 30 and above is considered obese.  Watch levels of cholesterol and blood lipids  You should start having your blood tested for lipids and cholesterol at 46 years of age, then have this test every 5 years.  You may need to have your cholesterol levels checked more often if:  Your lipid  or cholesterol levels are high.  You are older than 46 years of age.  You are at high risk for heart disease.  CANCER SCREENING   Lung Cancer  Lung cancer screening is recommended for adults 55-80 years old who are at high risk for lung cancer because of a history of smoking.  A yearly low-dose CT scan of the lungs is recommended for people who:  Currently smoke.  Have quit within the past 15 years.  Have at least a 30-pack-year history of smoking. A pack year is smoking an average of one pack of cigarettes a day for 1 year.  Yearly screening should continue until it has been 15 years since you quit.  Yearly screening should stop if you develop a health problem that would prevent you from having lung cancer treatment.  Breast Cancer  Practice breast self-awareness. This means understanding how your breasts normally appear and feel.  It also means doing regular breast self-exams. Let your health care provider know about any changes, no matter how small.  If you are in your 20s or 30s, you should have a clinical breast exam (CBE) by a health care provider every 1-3 years as part of a regular health exam.  If you are 40 or older, have a CBE every year. Also consider having a breast X-ray (mammogram) every year.  If you have a family history of breast cancer, talk to your health care provider about genetic screening.  If you   are at high risk for breast cancer, talk to your health care provider about having an MRI and a mammogram every year.  Breast cancer gene (BRCA) assessment is recommended for women who have family members with BRCA-related cancers. BRCA-related cancers include:  Breast.  Ovarian.  Tubal.  Peritoneal cancers.  Results of the assessment will determine the need for genetic counseling and BRCA1 and BRCA2 testing. Cervical Cancer Your health care provider may recommend that you be screened regularly for cancer of the pelvic organs (ovaries, uterus, and  vagina). This screening involves a pelvic examination, including checking for microscopic changes to the surface of your cervix (Pap test). You may be encouraged to have this screening done every 3 years, beginning at age 21.  For women ages 30-65, health care providers may recommend pelvic exams and Pap testing every 3 years, or they may recommend the Pap and pelvic exam, combined with testing for human papilloma virus (HPV), every 5 years. Some types of HPV increase your risk of cervical cancer. Testing for HPV may also be done on women of any age with unclear Pap test results.  Other health care providers may not recommend any screening for nonpregnant women who are considered low risk for pelvic cancer and who do not have symptoms. Ask your health care provider if a screening pelvic exam is right for you.  If you have had past treatment for cervical cancer or a condition that could lead to cancer, you need Pap tests and screening for cancer for at least 20 years after your treatment. If Pap tests have been discontinued, your risk factors (such as having a new sexual partner) need to be reassessed to determine if screening should resume. Some women have medical problems that increase the chance of getting cervical cancer. In these cases, your health care provider may recommend more frequent screening and Pap tests. Colorectal Cancer  This type of cancer can be detected and often prevented.  Routine colorectal cancer screening usually begins at 46 years of age and continues through 46 years of age.  Your health care provider may recommend screening at an earlier age if you have risk factors for colon cancer.  Your health care provider may also recommend using home test kits to check for hidden blood in the stool.  A small camera at the end of a tube can be used to examine your colon directly (sigmoidoscopy or colonoscopy). This is done to check for the earliest forms of colorectal  cancer.  Routine screening usually begins at age 50.  Direct examination of the colon should be repeated every 5-10 years through 46 years of age. However, you may need to be screened more often if early forms of precancerous polyps or small growths are found. Skin Cancer  Check your skin from head to toe regularly.  Tell your health care provider about any new moles or changes in moles, especially if there is a change in a mole's shape or color.  Also tell your health care provider if you have a mole that is larger than the size of a pencil eraser.  Always use sunscreen. Apply sunscreen liberally and repeatedly throughout the day.  Protect yourself by wearing long sleeves, pants, a wide-brimmed hat, and sunglasses whenever you are outside. HEART DISEASE, DIABETES, AND HIGH BLOOD PRESSURE   High blood pressure causes heart disease and increases the risk of stroke. High blood pressure is more likely to develop in:  People who have blood pressure in the high end   of the normal range (130-139/85-89 mm Hg).  People who are overweight or obese.  People who are African American.  If you are 38-23 years of age, have your blood pressure checked every 3-5 years. If you are 61 years of age or older, have your blood pressure checked every year. You should have your blood pressure measured twice--once when you are at a hospital or clinic, and once when you are not at a hospital or clinic. Record the average of the two measurements. To check your blood pressure when you are not at a hospital or clinic, you can use:  An automated blood pressure machine at a pharmacy.  A home blood pressure monitor.  If you are between 45 years and 39 years old, ask your health care provider if you should take aspirin to prevent strokes.  Have regular diabetes screenings. This involves taking a blood sample to check your fasting blood sugar level.  If you are at a normal weight and have a low risk for diabetes,  have this test once every three years after 46 years of age.  If you are overweight and have a high risk for diabetes, consider being tested at a younger age or more often. PREVENTING INFECTION  Hepatitis B  If you have a higher risk for hepatitis B, you should be screened for this virus. You are considered at high risk for hepatitis B if:  You were born in a country where hepatitis B is common. Ask your health care provider which countries are considered high risk.  Your parents were born in a high-risk country, and you have not been immunized against hepatitis B (hepatitis B vaccine).  You have HIV or AIDS.  You use needles to inject street drugs.  You live with someone who has hepatitis B.  You have had sex with someone who has hepatitis B.  You get hemodialysis treatment.  You take certain medicines for conditions, including cancer, organ transplantation, and autoimmune conditions. Hepatitis C  Blood testing is recommended for:  Everyone born from [birth year] through 1966.  Anyone with known risk factors for hepatitis C. Sexually transmitted infections (STIs)  You should be screened for sexually transmitted infections (STIs) including gonorrhea and chlamydia if:  You are sexually active and are younger than 46 years of age.  You are older than 46 years of age and your health care provider tells you that you are at risk for this type of infection.  Your sexual activity has changed since you were last screened and you are at an increased risk for chlamydia or gonorrhea. Ask your health care provider if you are at risk.  If you do not have HIV, but are at risk, it may be recommended that you take a prescription medicine daily to prevent HIV infection. This is called pre-exposure prophylaxis (PrEP). You are considered at risk if:  You are sexually active and do not regularly use condoms or know the HIV status of your partner(s).  You take drugs by injection.  You are sexually  active with a partner who has HIV. Talk with your health care provider about whether you are at high risk of being infected with HIV. If you choose to begin PrEP, you should first be tested for HIV. You should then be tested every 3 months for as long as you are taking PrEP.  PREGNANCY   If you are premenopausal and you may become pregnant, ask your health care provider about preconception counseling.  If you may  become pregnant, take 400 to 800 micrograms (mcg) of folic acid every day.  If you want to prevent pregnancy, talk to your health care provider about birth control (contraception). OSTEOPOROSIS AND MENOPAUSE   Osteoporosis is a disease in which the bones lose minerals and strength with aging. This can result in serious bone fractures. Your risk for osteoporosis can be identified using a bone density scan.  If you are 61 years of age or older, or if you are at risk for osteoporosis and fractures, ask your health care provider if you should be screened.  Ask your health care provider whether you should take a calcium or vitamin D supplement to lower your risk for osteoporosis.  Menopause may have certain physical symptoms and risks.  Hormone replacement therapy may reduce some of these symptoms and risks. Talk to your health care provider about whether hormone replacement therapy is right for you.  HOME CARE INSTRUCTIONS   Schedule regular health, dental, and eye exams.  Stay current with your immunizations.   Do not use any tobacco products including cigarettes, chewing tobacco, or electronic cigarettes.  If you are pregnant, do not drink alcohol.  If you are breastfeeding, limit how much and how often you drink alcohol.  Limit alcohol intake to no more than 1 drink per day for nonpregnant women. One drink equals 12 ounces of beer, 5 ounces of wine, or 1 ounces of hard liquor.  Do not use street drugs.  Do not share needles.  Ask your health care provider for help if  you need support or information about quitting drugs.  Tell your health care provider if you often feel depressed.  Tell your health care provider if you have ever been abused or do not feel safe at home.   This information is not intended to replace advice given to you by your health care provider. Make sure you discuss any questions you have with your health care provider.   Document Released: 02/28/2011 Document Revised: 09/05/2014 Document Reviewed: 07/17/2013 Elsevier Interactive Patient Education [employer].

## 2016-02-16 NOTE — Progress Notes (Signed)
Angelica Holt 12/08/1970 [id number]    History:    Presents for annual exam.  Monthly cycles/vasectomy with occasional hot flushes. Normal Pap and mammogram history.  Past medical history, past surgical history, family history and social history were all reviewed and documented in the EPIC chart. 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 Both received gardasil. Mother dementia, father heart disease, paternal grandmother breast cancer.  ROS:  A ROS was performed and pertinent positives and negatives are included.  Exam:  Filed Vitals:   02/16/16 1002  BP: 126/78    General appearance:  Normal Thyroid:  Symmetrical, normal in size, without palpable masses or nodularity. Respiratory  Auscultation:  Clear without wheezing or rhonchi Cardiovascular  Auscultation:  Regular rate, without rubs, murmurs or gallops  Edema/varicosities:  Not grossly evident Abdominal  Soft,nontender, without masses, guarding or rebound.  Liver/spleen:  No organomegaly noted  Hernia:  None appreciated  Skin  Inspection:  Grossly normal   Breasts: Examined lying and sitting.     Right: Without masses, retractions, discharge or axillary adenopathy.     Left: Without masses, retractions, discharge or axillary adenopathy. Gentitourinary   Inguinal/mons:  Normal without inguinal adenopathy  External genitalia:  Normal  BUS/Urethra/Skene's glands:  Normal  Vagina:  Normal  Cervix:  Normal  Uterus:   normal in size, shape and contour.  Midline and mobile  Adnexa/parametria:     Rt: Without masses or tenderness.   Lt: Without masses or tenderness.  Anus and perineum: Normal  Digital rectal exam: Normal sphincter tone without palpated masses or tenderness  Assessment/Plan:  45 y.o. MWF G2 P2 for annual exam with no complaints.  Monthly cycle/vasectomy Obesity  Plan: Reviewed importance of decreasing calories and increasing exercise or weight loss, has gained 10 pounds in the  past year. SBE's, continue annual screening mammogram, calcium rich diet, vitamin D 1000 daily encouraged. CBC, CMP, lipid panel, UA, Pap normal 2016, new screening guidelines reviewed.  Harrington ChallengerYOUNG,Jadore Mcguffin J WHNP, 11:10 AM 02/16/2016

## 2016-02-17 LAB — URINALYSIS W MICROSCOPIC + REFLEX CULTURE
Bilirubin Urine: NEGATIVE
CASTS: NONE SEEN [LPF]
CRYSTALS: NONE SEEN [HPF]
Glucose, UA: NEGATIVE
HGB URINE DIPSTICK: NEGATIVE
KETONES UR: NEGATIVE
Leukocytes, UA: NEGATIVE
Nitrite: NEGATIVE
PROTEIN: NEGATIVE
SPECIFIC GRAVITY, URINE: 1.022 (ref 1.001–1.035)
Yeast: NONE SEEN [HPF]
pH: 6 (ref 5.0–8.0)

## 2016-02-18 LAB — URINE CULTURE

## 2016-06-09 DIAGNOSIS — L821 Other seborrheic keratosis: Secondary | ICD-10-CM | POA: Diagnosis not present

## 2016-06-09 DIAGNOSIS — D225 Melanocytic nevi of trunk: Secondary | ICD-10-CM | POA: Diagnosis not present

## 2016-11-08 ENCOUNTER — Other Ambulatory Visit: Payer: Self-pay | Admitting: Women's Health

## 2016-11-08 DIAGNOSIS — Z1231 Encounter for screening mammogram for malignant neoplasm of breast: Secondary | ICD-10-CM

## 2016-12-09 ENCOUNTER — Ambulatory Visit
Admission: RE | Admit: 2016-12-09 | Discharge: 2016-12-09 | Disposition: A | Discharge status: Home / Self Care | Payer: BLUE CROSS/BLUE SHIELD | Source: Ambulatory Visit | Attending: Women's Health | Admitting: Women's Health

## 2016-12-09 DIAGNOSIS — Z1231 Encounter for screening mammogram for malignant neoplasm of breast: Secondary | ICD-10-CM | POA: Diagnosis not present

## 2016-12-21 DIAGNOSIS — Z0100 Encounter for examination of eyes and vision without abnormal findings: Secondary | ICD-10-CM | POA: Diagnosis not present

## 2017-02-20 ENCOUNTER — Ambulatory Visit (INDEPENDENT_AMBULATORY_CARE_PROVIDER_SITE_OTHER): Payer: BLUE CROSS/BLUE SHIELD | Admitting: Women's Health

## 2017-02-20 ENCOUNTER — Encounter: Payer: Self-pay | Admitting: Women's Health

## 2017-02-20 VITALS — BP 118/70 | Ht 65.0 in | Wt 199.0 lb

## 2017-02-20 DIAGNOSIS — Z01419 Encounter for gynecological examination (general) (routine) without abnormal findings: Secondary | ICD-10-CM | POA: Diagnosis not present

## 2017-02-20 DIAGNOSIS — Z13220 Encounter for screening for lipoid disorders: Secondary | ICD-10-CM

## 2017-02-20 LAB — CBC WITH DIFFERENTIAL/PLATELET
Basophils Absolute: 58 cells/uL (ref 0–200)
Basophils Relative: 1 %
EOS PCT: 1 %
Eosinophils Absolute: 58 cells/uL (ref 15–500)
HCT: 42.2 % (ref 35.0–45.0)
HEMOGLOBIN: 13.8 g/dL (ref 11.7–15.5)
LYMPHS ABS: 1682 {cells}/uL (ref 850–3900)
LYMPHS PCT: 29 %
MCH: 28.8 pg (ref 27.0–33.0)
MCHC: 32.7 g/dL (ref 32.0–36.0)
MCV: 88.1 fL (ref 80.0–100.0)
MONOS PCT: 10 %
MPV: 10.4 fL (ref 7.5–12.5)
Monocytes Absolute: 580 cells/uL (ref 200–950)
Neutro Abs: 3422 cells/uL (ref 1500–7800)
Neutrophils Relative %: 59 %
PLATELETS: 237 10*3/uL (ref 140–400)
RBC: 4.79 MIL/uL (ref 3.80–5.10)
RDW: 13.7 % (ref 11.0–15.0)
WBC: 5.8 10*3/uL (ref 3.8–10.8)

## 2017-02-20 LAB — COMPREHENSIVE METABOLIC PANEL
ALT: 22 U/L (ref 6–29)
AST: 21 U/L (ref 10–35)
Albumin: 4.1 g/dL (ref 3.6–5.1)
Alkaline Phosphatase: 73 U/L (ref 33–115)
BILIRUBIN TOTAL: 0.4 mg/dL (ref 0.2–1.2)
BUN: 10 mg/dL (ref 7–25)
CO2: 21 mmol/L (ref 20–31)
CREATININE: 0.65 mg/dL (ref 0.50–1.10)
Calcium: 9.2 mg/dL (ref 8.6–10.2)
Chloride: 103 mmol/L (ref 98–110)
GLUCOSE: 82 mg/dL (ref 65–99)
Potassium: 4 mmol/L (ref 3.5–5.3)
SODIUM: 136 mmol/L (ref 135–146)
Total Protein: 6.5 g/dL (ref 6.1–8.1)

## 2017-02-20 LAB — LIPID PANEL
Cholesterol: 223 mg/dL — ABNORMAL HIGH (ref <200)
HDL: 64 mg/dL (ref >50)
LDL CALC: 140 mg/dL — AB (ref <100)
Total CHOL/HDL Ratio: 3.5 Ratio (ref <5.0)
Triglycerides: 94 mg/dL (ref <150)
VLDL: 19 mg/dL (ref <30)

## 2017-02-20 NOTE — Patient Instructions (Signed)
dHealth Maintenance, Female Adopting a healthy lifestyle and getting preventive care can go a long way to promote health and wellness. Talk with your health care provider about what schedule of regular examinations is right for you. This is a good chance for you to check in with your provider about disease prevention and staying healthy. In between checkups, there are plenty of things you can do on your own. Experts have done a lot of research about which lifestyle changes and preventive measures are most likely to keep you healthy. Ask your health care provider for more information. Weight and diet Eat a healthy diet  Be sure to include plenty of vegetables, fruits, low-fat dairy products, and lean protein.  Do not eat a lot of foods high in solid fats, added sugars, or salt.  Get regular exercise. This is one of the most important things you can do for your health. ? Most adults should exercise for at least 150 minutes each week. The exercise should increase your heart rate and make you sweat (moderate-intensity exercise). ? Most adults should also do strengthening exercises at least twice a week. This is in addition to the moderate-intensity exercise.  Maintain a healthy weight  Body mass index (BMI) is a measurement that can be used to identify possible weight problems. It estimates body fat based on height and weight. Your health care provider can help determine your BMI and help you achieve or maintain a healthy weight.  For females 64 years of age and older: ? A BMI below 18.5 is considered underweight. ? A BMI of 18.5 to 24.9 is normal. ? A BMI of 25 to 29.9 is considered overweight. ? A BMI of 30 and above is considered obese.  Watch levels of cholesterol and blood lipids  You should start having your blood tested for lipids and cholesterol at 47 years of age, then have this test every 5 years.  You may need to have your cholesterol levels checked more often if: ? Your lipid or  cholesterol levels are high. ? You are older than 47 years of age. ? You are at high risk for heart disease.  Cancer screening Lung Cancer  Lung cancer screening is recommended for adults 44-69 years old who are at high risk for lung cancer because of a history of smoking.  A yearly low-dose CT scan of the lungs is recommended for people who: ? Currently smoke. ? Have quit within the past 15 years. ? Have at least a 30-pack-year history of smoking. A pack year is smoking an average of one pack of cigarettes a day for 1 year.  Yearly screening should continue until it has been 15 years since you quit.  Yearly screening should stop if you develop a health problem that would prevent you from having lung cancer treatment.  Breast Cancer  Practice breast self-awareness. This means understanding how your breasts normally appear and feel.  It also means doing regular breast self-exams. Let your health care provider know about any changes, no matter how small.  If you are in your 20s or 30s, you should have a clinical breast exam (CBE) by a health care provider every 1-3 years as part of a regular health exam.  If you are 84 or older, have a CBE every year. Also consider having a breast X-ray (mammogram) every year.  If you have a family history of breast cancer, talk to your health care provider about genetic screening.  If you are at high risk  for breast cancer, talk to your health care provider about having an MRI and a mammogram every year.  Breast cancer gene (BRCA) assessment is recommended for women who have family members with BRCA-related cancers. BRCA-related cancers include: ? Breast. ? Ovarian. ? Tubal. ? Peritoneal cancers.  Results of the assessment will determine the need for genetic counseling and BRCA1 and BRCA2 testing.  Cervical Cancer Your health care provider may recommend that you be screened regularly for cancer of the pelvic organs (ovaries, uterus, and  vagina). This screening involves a pelvic examination, including checking for microscopic changes to the surface of your cervix (Pap test). You may be encouraged to have this screening done every 3 years, beginning at age 22.  For women ages 56-65, health care providers may recommend pelvic exams and Pap testing every 3 years, or they may recommend the Pap and pelvic exam, combined with testing for human papilloma virus (HPV), every 5 years. Some types of HPV increase your risk of cervical cancer. Testing for HPV may also be done on women of any age with unclear Pap test results.  Other health care providers may not recommend any screening for nonpregnant women who are considered low risk for pelvic cancer and who do not have symptoms. Ask your health care provider if a screening pelvic exam is right for you.  If you have had past treatment for cervical cancer or a condition that could lead to cancer, you need Pap tests and screening for cancer for at least 20 years after your treatment. If Pap tests have been discontinued, your risk factors (such as having a new sexual partner) need to be reassessed to determine if screening should resume. Some women have medical problems that increase the chance of getting cervical cancer. In these cases, your health care provider may recommend more frequent screening and Pap tests.  Colorectal Cancer  This type of cancer can be detected and often prevented.  Routine colorectal cancer screening usually begins at 47 years of age and continues through 47 years of age.  Your health care provider may recommend screening at an earlier age if you have risk factors for colon cancer.  Your health care provider may also recommend using home test kits to check for hidden blood in the stool.  A small camera at the end of a tube can be used to examine your colon directly (sigmoidoscopy or colonoscopy). This is done to check for the earliest forms of colorectal  cancer.  Routine screening usually begins at age 33.  Direct examination of the colon should be repeated every 5-10 years through 47 years of age. However, you may need to be screened more often if early forms of precancerous polyps or small growths are found.  Skin Cancer  Check your skin from head to toe regularly.  Tell your health care provider about any new moles or changes in moles, especially if there is a change in a mole's shape or color.  Also tell your health care provider if you have a mole that is larger than the size of a pencil eraser.  Always use sunscreen. Apply sunscreen liberally and repeatedly throughout the day.  Protect yourself by wearing long sleeves, pants, a wide-brimmed hat, and sunglasses whenever you are outside.  Heart disease, diabetes, and high blood pressure  High blood pressure causes heart disease and increases the risk of stroke. High blood pressure is more likely to develop in: ? People who have blood pressure in the high end of  the normal range (130-139/85-89 mm Hg). ? People who are overweight or obese. ? People who are African American.  If you are 21-29 years of age, have your blood pressure checked every 3-5 years. If you are 3 years of age or older, have your blood pressure checked every year. You should have your blood pressure measured twice-once when you are at a hospital or clinic, and once when you are not at a hospital or clinic. Record the average of the two measurements. To check your blood pressure when you are not at a hospital or clinic, you can use: ? An automated blood pressure machine at a pharmacy. ? A home blood pressure monitor.  If you are between 17 years and 37 years old, ask your health care provider if you should take aspirin to prevent strokes.  Have regular diabetes screenings. This involves taking a blood sample to check your fasting blood sugar level. ? If you are at a normal weight and have a low risk for diabetes,  have this test once every three years after 47 years of age. ? If you are overweight and have a high risk for diabetes, consider being tested at a younger age or more often. Preventing infection Hepatitis B  If you have a higher risk for hepatitis B, you should be screened for this virus. You are considered at high risk for hepatitis B if: ? You were born in a country where hepatitis B is common. Ask your health care provider which countries are considered high risk. ? Your parents were born in a high-risk country, and you have not been immunized against hepatitis B (hepatitis B vaccine). ? You have HIV or AIDS. ? You use needles to inject street drugs. ? You live with someone who has hepatitis B. ? You have had sex with someone who has hepatitis B. ? You get hemodialysis treatment. ? You take certain medicines for conditions, including cancer, organ transplantation, and autoimmune conditions.  Hepatitis C  Blood testing is recommended for: ? Everyone born from [birth year] through 1966. ? Anyone with known risk factors for hepatitis C.  Sexually transmitted infections (STIs)  You should be screened for sexually transmitted infections (STIs) including gonorrhea and chlamydia if: ? You are sexually active and are younger than 47 years of age. ? You are older than 47 years of age and your health care provider tells you that you are at risk for this type of infection. ? Your sexual activity has changed since you were last screened and you are at an increased risk for chlamydia or gonorrhea. Ask your health care provider if you are at risk.  If you do not have HIV, but are at risk, it may be recommended that you take a prescription medicine daily to prevent HIV infection. This is called pre-exposure prophylaxis (PrEP). You are considered at risk if: ? You are sexually active and do not regularly use condoms or know the HIV status of your partner(s). ? You take drugs by injection. ? You are  sexually active with a partner who has HIV.  Talk with your health care provider about whether you are at high risk of being infected with HIV. If you choose to begin PrEP, you should first be tested for HIV. You should then be tested every 3 months for as long as you are taking PrEP. Pregnancy  If you are premenopausal and you may become pregnant, ask your health care provider about preconception counseling.  If you may become  pregnant, take 400 to 800 micrograms (mcg) of folic acid every day.  If you want to prevent pregnancy, talk to your health care provider about birth control (contraception). Osteoporosis and menopause  Osteoporosis is a disease in which the bones lose minerals and strength with aging. This can result in serious bone fractures. Your risk for osteoporosis can be identified using a bone density scan.  If you are 65 years of age or older, or if you are at risk for osteoporosis and fractures, ask your health care provider if you should be screened.  Ask your health care provider whether you should take a calcium or vitamin D supplement to lower your risk for osteoporosis.  Menopause may have certain physical symptoms and risks.  Hormone replacement therapy may reduce some of these symptoms and risks. Talk to your health care provider about whether hormone replacement therapy is right for you. Follow these instructions at home:  Schedule regular health, dental, and eye exams.  Stay current with your immunizations.  Do not use any tobacco products including cigarettes, chewing tobacco, or electronic cigarettes.  If you are pregnant, do not drink alcohol.  If you are breastfeeding, limit how much and how often you drink alcohol.  Limit alcohol intake to no more than 1 drink per day for nonpregnant women. One drink equals 12 ounces of beer, 5 ounces of wine, or 1 ounces of hard liquor.  Do not use street drugs.  Do not share needles.  Ask your health care  provider for help if you need support or information about quitting drugs.  Tell your health care provider if you often feel depressed.  Tell your health care provider if you have ever been abused or do not feel safe at home. This information is not intended to replace advice given to you by your health care provider. Make sure you discuss any questions you have with your health care provider. Document Released: 02/28/2011 Document Revised: 01/21/2016 Document Reviewed: 05/19/2015 Elsevier Interactive Patient Education  2018 [employer]  Carbohydrate Counting for Diabetes Mellitus, Adult Carbohydrate counting is a method for keeping track of how many carbohydrates you eat. Eating carbohydrates naturally increases the amount of sugar (glucose) in the blood. Counting how many carbohydrates you eat helps keep your blood glucose within normal limits, which helps you manage your diabetes (diabetes mellitus). It is important to know how many carbohydrates you can safely have in each meal. This is different for every person. A diet and nutrition specialist (registered dietitian) can help you make a meal plan and calculate how many carbohydrates you should have at each meal and snack. Carbohydrates are found in the following foods:  Grains, such as breads and cereals.  Dried beans and soy products.  Starchy vegetables, such as potatoes, peas, and corn.  Fruit and fruit juices.  Milk and yogurt.  Sweets and snack foods, such as cake, cookies, candy, chips, and soft drinks.  How do I count carbohydrates? There are two ways to count carbohydrates in food. You can use either of the methods or a combination of both. Reading "Nutrition Facts" on packaged food The "Nutrition Facts" list is included on the labels of almost all packaged foods and beverages in the [place] It includes:  The serving size.  Information about nutrients in each serving, including the grams (g) of carbohydrate per  serving.  To use the "Nutrition Facts":  Decide how many servings you will have.  Multiply the number of servings by the number of   carbohydrates per serving.  The resulting number is the total amount of carbohydrates that you will be having.  Learning standard serving sizes of other foods When you eat foods containing carbohydrates that are not packaged or do not include "Nutrition Facts" on the label, you need to measure the servings in order to count the amount of carbohydrates:  Measure the foods that you will eat with a food scale or measuring cup, if needed.  Decide how many standard-size servings you will eat.  Multiply the number of servings by 15. Most carbohydrate-rich foods have about 15 g of carbohydrates per serving. ? For example, if you eat 8 oz (170 g) of strawberries, you will have eaten 2 servings and 30 g of carbohydrates (2 servings x 15 g = 30 g).  For foods that have more than one food mixed, such as soups and casseroles, you must count the carbohydrates in each food that is included.  The following list contains standard serving sizes of common carbohydrate-rich foods. Each of these servings has about 15 g of carbohydrates:   hamburger bun or  English muffin.   oz (15 mL) syrup.   oz (14 g) jelly.  1 slice of bread.  1 six-inch tortilla.  3 oz (85 g) cooked rice or pasta.  4 oz (113 g) cooked dried beans.  4 oz (113 g) starchy vegetable, such as peas, corn, or potatoes.  4 oz (113 g) hot cereal.  4 oz (113 g) mashed potatoes or  of a large baked potato.  4 oz (113 g) canned or frozen fruit.  4 oz (120 mL) fruit juice.  4-6 crackers.  6 chicken nuggets.  6 oz (170 g) unsweetened dry cereal.  6 oz (170 g) plain fat-free yogurt or yogurt sweetened with artificial sweeteners.  8 oz (240 mL) milk.  8 oz (170 g) fresh fruit or one small piece of fruit.  24 oz (680 g) popped popcorn.  Example of carbohydrate counting Sample meal  3  oz (85 g) chicken breast.  6 oz (170 g) brown rice.  4 oz (113 g) corn.  8 oz (240 mL) milk.  8 oz (170 g) strawberries with sugar-free whipped topping. Carbohydrate calculation 1. Identify the foods that contain carbohydrates: ? Rice. ? Corn. ? Milk. ? Strawberries. 2. Calculate how many servings you have of each food: ? 2 servings rice. ? 1 serving corn. ? 1 serving milk. ? 1 serving strawberries. 3. Multiply each number of servings by 15 g: ? 2 servings rice x 15 g = 30 g. ? 1 serving corn x 15 g = 15 g. ? 1 serving milk x 15 g = 15 g. ? 1 serving strawberries x 15 g = 15 g. 4. Add together all of the amounts to find the total grams of carbohydrates eaten: ? 30 g + 15 g + 15 g + 15 g = 75 g of carbohydrates total. This information is not intended to replace advice given to you by your health care provider. Make sure you discuss any questions you have with your health care provider. Document Released: 08/15/2005 Document Revised: 03/04/2016 Document Reviewed: 01/27/2016 Elsevier Interactive Patient Education  [employer].

## 2017-02-20 NOTE — Progress Notes (Signed)
Angelica Holt 05/18/1971 [id number]    History:    Presents for annual exam.  Monthly cycle/vasectomy. Normal Pap and mammogram history.  Past medical history, past surgical history, family history and social history were all reviewed and documented in the EPIC chart. 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 Mother dementia father does care for her.  ROS:  A ROS was performed and pertinent positives and negatives are included.  Exam:  Vitals:   02/20/17 1120  BP: 118/70  Weight: 199 lb (90.3 kg)  Height: 5\' 5"  (1.651 m)   Body mass index is 33.12 kg/m.   General appearance:  Normal Thyroid:  Symmetrical, normal in size, without palpable masses or nodularity. Respiratory  Auscultation:  Clear without wheezing or rhonchi Cardiovascular  Auscultation:  Regular rate, without rubs, murmurs or gallops  Edema/varicosities:  Not grossly evident Abdominal  Soft,nontender, without masses, guarding or rebound.  Liver/spleen:  No organomegaly noted  Hernia:  None appreciated  Skin  Inspection:  Grossly normal   Breasts: Examined lying and sitting.     Right: Without masses, retractions, discharge or axillary adenopathy.     Left: Without masses, retractions, discharge or axillary adenopathy. Gentitourinary   Inguinal/mons:  Normal without inguinal adenopathy  External genitalia:  Normal  BUS/Urethra/Skene's glands:  Normal  Vagina:  Normal  Cervix:  Normal  Uterus:   normal in size, shape and contour.  Midline and mobile  Adnexa/parametria:     Rt: Without masses or tenderness.   Lt: Without masses or tenderness.  Anus and perineum: Normal  Digital rectal exam: Normal sphincter tone without palpated masses or tenderness  Assessment/Plan:  46 y.o. MWF G2 P2 for annual exam with no complaints.  Regular monthly cycle/vasectomy Obesity  Plan: SBE's, continue annual screening mammogram, calcium rich diet, increase  exercise and decrease calories for weight loss encouraged. CBC, CMP, lipid panel, UA, Pap normal 2016, new screening guidelines reviewed.[name].    Angelica Holt [facility] LLCWHNP, 1:14 PM 02/20/2017

## 2017-02-21 ENCOUNTER — Other Ambulatory Visit: Payer: Self-pay | Admitting: Women's Health

## 2017-02-21 DIAGNOSIS — E7800 Pure hypercholesterolemia, unspecified: Secondary | ICD-10-CM

## 2017-02-21 DIAGNOSIS — R7989 Other specified abnormal findings of blood chemistry: Secondary | ICD-10-CM

## 2017-08-25 ENCOUNTER — Other Ambulatory Visit: Payer: BLUE CROSS/BLUE SHIELD

## 2017-08-25 DIAGNOSIS — E7800 Pure hypercholesterolemia, unspecified: Secondary | ICD-10-CM

## 2017-08-25 DIAGNOSIS — R7989 Other specified abnormal findings of blood chemistry: Secondary | ICD-10-CM | POA: Diagnosis not present

## 2017-08-25 LAB — LIPID PANEL
CHOL/HDL RATIO: 3.1 (calc) (ref <5.0)
Cholesterol: 242 mg/dL — ABNORMAL HIGH (ref <200)
HDL: 78 mg/dL (ref >50)
LDL Cholesterol (Calc): 149 mg/dL (calc) — ABNORMAL HIGH
NON-HDL CHOLESTEROL (CALC): 164 mg/dL — AB (ref <130)
Triglycerides: 60 mg/dL (ref <150)

## 2017-10-02 DIAGNOSIS — Z713 Dietary counseling and surveillance: Secondary | ICD-10-CM | POA: Diagnosis not present

## 2017-11-06 DIAGNOSIS — Z6831 Body mass index (BMI) 31.0-31.9, adult: Secondary | ICD-10-CM | POA: Diagnosis not present

## 2017-11-06 DIAGNOSIS — Z713 Dietary counseling and surveillance: Secondary | ICD-10-CM | POA: Diagnosis not present

## 2017-11-06 DIAGNOSIS — E669 Obesity, unspecified: Secondary | ICD-10-CM | POA: Diagnosis not present

## 2017-11-07 ENCOUNTER — Other Ambulatory Visit: Payer: Self-pay | Admitting: Women's Health

## 2017-11-07 DIAGNOSIS — Z139 Encounter for screening, unspecified: Secondary | ICD-10-CM

## 2017-12-11 ENCOUNTER — Ambulatory Visit
Admission: RE | Admit: 2017-12-11 | Discharge: 2017-12-11 | Disposition: A | Discharge status: Home / Self Care | Payer: BLUE CROSS/BLUE SHIELD | Source: Ambulatory Visit | Attending: Women's Health | Admitting: Women's Health

## 2017-12-11 DIAGNOSIS — Z139 Encounter for screening, unspecified: Secondary | ICD-10-CM

## 2017-12-11 DIAGNOSIS — Z1231 Encounter for screening mammogram for malignant neoplasm of breast: Secondary | ICD-10-CM | POA: Diagnosis not present

## 2018-01-08 DIAGNOSIS — Z6830 Body mass index (BMI) 30.0-30.9, adult: Secondary | ICD-10-CM | POA: Diagnosis not present

## 2018-01-08 DIAGNOSIS — Z713 Dietary counseling and surveillance: Secondary | ICD-10-CM | POA: Diagnosis not present

## 2018-01-08 DIAGNOSIS — E669 Obesity, unspecified: Secondary | ICD-10-CM | POA: Diagnosis not present

## 2018-03-07 ENCOUNTER — Encounter: Payer: Self-pay | Admitting: Women's Health

## 2018-03-07 ENCOUNTER — Ambulatory Visit (INDEPENDENT_AMBULATORY_CARE_PROVIDER_SITE_OTHER): Payer: BLUE CROSS/BLUE SHIELD | Admitting: Women's Health

## 2018-03-07 VITALS — BP 118/74 | Ht 65.0 in | Wt 184.6 lb

## 2018-03-07 DIAGNOSIS — Z13220 Encounter for screening for lipoid disorders: Secondary | ICD-10-CM | POA: Diagnosis not present

## 2018-03-07 DIAGNOSIS — Z01419 Encounter for gynecological examination (general) (routine) without abnormal findings: Secondary | ICD-10-CM

## 2018-03-07 NOTE — Patient Instructions (Signed)

## 2018-03-07 NOTE — Addendum Note (Signed)
Addended by: Becky SaxPARKER, Porsche Noguchi M on: 03/07/2018 11:15 AM   Modules accepted: Orders

## 2018-03-07 NOTE — Progress Notes (Signed)
Angelica Holt 11/24/1970 [id number]    History:    Presents for annual exam.  Regular monthly cycle/vasectomy.  Normal Pap and mammogram history.  Has had some elevated lipid panels has lost 15 pounds in the past year with diet and exercise.  Past medical history, past surgical history, family history and social history were all reviewed and documented in the EPIC chart.  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  Son out of college living in [place].  Daughter senior in college doing an internship this summer.  ROS:  A ROS was performed and pertinent positives and negatives are included.  Exam:  Vitals:   03/07/18 1047  BP: 118/74  Weight: 184 lb 9.6 oz (83.7 kg)  Height: 5\' 5"  (1.651 m)   Body mass index is 30.72 kg/m.   General appearance:  Normal Thyroid:  Symmetrical, normal in size, without palpable masses or nodularity. Respiratory  Auscultation:  Clear without wheezing or rhonchi Cardiovascular  Auscultation:  Regular rate, without rubs, murmurs or gallops  Edema/varicosities:  Not grossly evident Abdominal  Soft,nontender, without masses, guarding or rebound.  Liver/spleen:  No organomegaly noted  Hernia:  None appreciated  Skin  Inspection:  Grossly normal   Breasts: Examined lying and sitting.     Right: Without masses, retractions, discharge or axillary adenopathy.     Left: Without masses, retractions, discharge or axillary adenopathy. Gentitourinary   Inguinal/mons:  Normal without inguinal adenopathy  External genitalia:  Normal  BUS/Urethra/Skene's glands:  Normal  Vagina:  Normal  Cervix:  Normal  Uterus:   normal in size, shape and contour.  Midline and mobile  Adnexa/parametria:     Rt: Without masses or tenderness.   Lt: Without masses or tenderness.  Anus and perineum: Normal  Digital rectal exam: Normal sphincter tone without palpated masses or tenderness  Assessment/Plan:  47 y.o. WF G2, P2 for annual exam with complaint of right shoulder  pain.  Monthly cycle/vasectomy Overweight, 15 pound weight loss in the past year  Plan: Congratulated on weight loss, continue healthy lifestyle of diet and exercise.  Reviewed since full range of motion of right arm and shoulder most likely bursitis, reviewed importance of preventing overuse.  Motrin as needed and if continues problematic orthopedist.  SBE's, continue annual screening mammogram, calcium rich foods, vitamin D 1000 daily encouraged.  CBC, CMP, lipid panel, Pap with HR HPV typing, new screening guidelines reviewed.    Angelica Holt, 10:52 AM 03/07/2018

## 2018-03-08 LAB — COMPREHENSIVE METABOLIC PANEL
AG Ratio: 1.9 (calc) (ref 1.0–2.5)
ALBUMIN MSPROF: 4.2 g/dL (ref 3.6–5.1)
ALKALINE PHOSPHATASE (APISO): 71 U/L (ref 33–115)
ALT: 16 U/L (ref 6–29)
AST: 19 U/L (ref 10–35)
BUN: 12 mg/dL (ref 7–25)
CO2: 22 mmol/L (ref 20–32)
Calcium: 9.1 mg/dL (ref 8.6–10.2)
Chloride: 104 mmol/L (ref 98–110)
Creat: 0.61 mg/dL (ref 0.50–1.10)
GLOBULIN: 2.2 g/dL (ref 1.9–3.7)
Glucose, Bld: 81 mg/dL (ref 65–99)
Potassium: 4 mmol/L (ref 3.5–5.3)
SODIUM: 137 mmol/L (ref 135–146)
TOTAL PROTEIN: 6.4 g/dL (ref 6.1–8.1)
Total Bilirubin: 0.6 mg/dL (ref 0.2–1.2)

## 2018-03-08 LAB — PAP, TP IMAGING W/ HPV RNA, RFLX HPV TYPE 16,18/45: HPV DNA High Risk: NOT DETECTED

## 2018-03-08 LAB — CBC WITH DIFFERENTIAL/PLATELET
BASOS PCT: 0.4 %
Basophils Absolute: 20 cells/uL (ref 0–200)
EOS ABS: 61 {cells}/uL (ref 15–500)
Eosinophils Relative: 1.2 %
HCT: 41.8 % (ref 35.0–45.0)
Hemoglobin: 13.8 g/dL (ref 11.7–15.5)
Lymphs Abs: 1576 cells/uL (ref 850–3900)
MCH: 28.8 pg (ref 27.0–33.0)
MCHC: 33 g/dL (ref 32.0–36.0)
MCV: 87.1 fL (ref 80.0–100.0)
MPV: 11.2 fL (ref 7.5–12.5)
Monocytes Relative: 7.2 %
NEUTROS PCT: 60.3 %
Neutro Abs: 3075 cells/uL (ref 1500–7800)
PLATELETS: 223 10*3/uL (ref 140–400)
RBC: 4.8 10*6/uL (ref 3.80–5.10)
RDW: 12.7 % (ref 11.0–15.0)
TOTAL LYMPHOCYTE: 30.9 %
WBC: 5.1 10*3/uL (ref 3.8–10.8)
WBCMIX: 367 {cells}/uL (ref 200–950)

## 2018-03-08 LAB — LIPID PANEL
CHOLESTEROL: 234 mg/dL — AB (ref <200)
HDL: 72 mg/dL (ref >50)
LDL Cholesterol (Calc): 144 mg/dL (calc) — ABNORMAL HIGH
NON-HDL CHOLESTEROL (CALC): 162 mg/dL — AB (ref <130)
Total CHOL/HDL Ratio: 3.3 (calc) (ref <5.0)
Triglycerides: 79 mg/dL (ref <150)

## 2018-04-11 DIAGNOSIS — Z713 Dietary counseling and surveillance: Secondary | ICD-10-CM | POA: Diagnosis not present

## 2018-04-11 DIAGNOSIS — Z6829 Body mass index (BMI) 29.0-29.9, adult: Secondary | ICD-10-CM | POA: Diagnosis not present

## 2018-04-11 DIAGNOSIS — E669 Obesity, unspecified: Secondary | ICD-10-CM | POA: Diagnosis not present

## 2018-05-07 DIAGNOSIS — Z0100 Encounter for examination of eyes and vision without abnormal findings: Secondary | ICD-10-CM | POA: Diagnosis not present

## 2018-06-07 ENCOUNTER — Telehealth: Payer: Self-pay | Admitting: *Deleted

## 2018-06-07 NOTE — Telephone Encounter (Signed)
(  patient aware you are out of the office) Patient called report cycle started 3 weeks ago and still having daily bleeding, wearing pads changing every 3 hours, no night sweats, maybe a hot flash off/on, but not daily.  monthly cycles until now, she did mention last 2 months cycles came about 5-6 days later. Please advise

## 2018-06-10 NOTE — Telephone Encounter (Signed)
Message left

## 2018-06-11 NOTE — Telephone Encounter (Signed)
Patient called back and left this number for you to call her [phone].  or schedule OV?

## 2018-06-11 NOTE — Telephone Encounter (Signed)
TC states bleeding has now stopped, first time cycle has lasted greater than 7 days, will watch at this time if it occurs next month we will proceed with sonohysterogram.

## 2018-07-09 DIAGNOSIS — Z6828 Body mass index (BMI) 28.0-28.9, adult: Secondary | ICD-10-CM | POA: Diagnosis not present

## 2018-07-09 DIAGNOSIS — Z713 Dietary counseling and surveillance: Secondary | ICD-10-CM | POA: Diagnosis not present

## 2018-07-09 DIAGNOSIS — Z23 Encounter for immunization: Secondary | ICD-10-CM | POA: Diagnosis not present

## 2018-07-09 DIAGNOSIS — E669 Obesity, unspecified: Secondary | ICD-10-CM | POA: Diagnosis not present

## 2018-10-22 DIAGNOSIS — Z6829 Body mass index (BMI) 29.0-29.9, adult: Secondary | ICD-10-CM | POA: Diagnosis not present

## 2018-10-22 DIAGNOSIS — Z713 Dietary counseling and surveillance: Secondary | ICD-10-CM | POA: Diagnosis not present

## 2018-10-22 DIAGNOSIS — E663 Overweight: Secondary | ICD-10-CM | POA: Diagnosis not present

## 2018-11-09 ENCOUNTER — Other Ambulatory Visit: Payer: Self-pay | Admitting: Women's Health

## 2018-11-09 DIAGNOSIS — Z1231 Encounter for screening mammogram for malignant neoplasm of breast: Secondary | ICD-10-CM

## 2018-12-13 ENCOUNTER — Ambulatory Visit: Payer: BLUE CROSS/BLUE SHIELD

## 2019-01-14 DIAGNOSIS — E663 Overweight: Secondary | ICD-10-CM | POA: Diagnosis not present

## 2019-01-14 DIAGNOSIS — Z713 Dietary counseling and surveillance: Secondary | ICD-10-CM | POA: Diagnosis not present

## 2019-01-14 DIAGNOSIS — Z6828 Body mass index (BMI) 28.0-28.9, adult: Secondary | ICD-10-CM | POA: Diagnosis not present

## 2019-01-31 ENCOUNTER — Ambulatory Visit: Payer: BLUE CROSS/BLUE SHIELD

## 2019-02-28 ENCOUNTER — Other Ambulatory Visit: Payer: Self-pay

## 2019-02-28 ENCOUNTER — Ambulatory Visit
Admission: RE | Admit: 2019-02-28 | Discharge: 2019-02-28 | Disposition: A | Discharge status: Home / Self Care | Payer: BC Managed Care – PPO | Source: Ambulatory Visit | Attending: Women's Health | Admitting: Women's Health

## 2019-02-28 DIAGNOSIS — Z1231 Encounter for screening mammogram for malignant neoplasm of breast: Secondary | ICD-10-CM | POA: Diagnosis not present

## 2019-03-18 ENCOUNTER — Ambulatory Visit (INDEPENDENT_AMBULATORY_CARE_PROVIDER_SITE_OTHER): Payer: BC Managed Care – PPO | Admitting: Women's Health

## 2019-03-18 ENCOUNTER — Encounter: Payer: Self-pay | Admitting: Women's Health

## 2019-03-18 ENCOUNTER — Other Ambulatory Visit: Payer: Self-pay

## 2019-03-18 VITALS — BP 122/80 | Ht 65.0 in | Wt 179.0 lb

## 2019-03-18 DIAGNOSIS — Z01419 Encounter for gynecological examination (general) (routine) without abnormal findings: Secondary | ICD-10-CM | POA: Diagnosis not present

## 2019-03-18 DIAGNOSIS — Z13220 Encounter for screening for lipoid disorders: Secondary | ICD-10-CM

## 2019-03-18 NOTE — Progress Notes (Signed)
HARLEEN FINEBERG 09-Jun-1971 [id number]    History:    Presents for annual exam.  Regular monthly cycles/vasectomy.  Normal Pap and mammogram history.  Reports 1 unusually heavy cycle last year.  Past medical history, past surgical history, family history and social history were all reviewed and documented in the EPIC chart.  Preschool teacher.  Son is 24 out of college working doing well, daughter 63 recently graduated with event planning and is employed.  Mother dementia Father cares for her.  ROS:  A ROS was performed and pertinent positives and negatives are included.  Exam:  Vitals:   03/18/19 1118  BP: 122/80  Weight: 179 lb (81.2 kg)  Height: 5\' 5"  (1.651 m)   Body mass index is 29.79 kg/m.   General appearance:  Normal Thyroid:  Symmetrical, normal in size, without palpable masses or nodularity. Respiratory  Auscultation:  Clear without wheezing or rhonchi Cardiovascular  Auscultation:  Regular rate, without rubs, murmurs or gallops  Edema/varicosities:  Not grossly evident Abdominal  Soft,nontender, without masses, guarding or rebound.  Liver/spleen:  No organomegaly noted  Hernia:  None appreciated  Skin  Inspection:  Grossly normal   Breasts: Examined lying and sitting.     Right: Without masses, retractions, discharge or axillary adenopathy.     Left: Without masses, retractions, discharge or axillary adenopathy. Gentitourinary   Inguinal/mons:  Normal without inguinal adenopathy  External genitalia:  Normal  BUS/Urethra/Skene's glands:  Normal  Vagina:  Normal  Cervix:  Normal  Uterus:  normal in size, shape and contour.  Midline and mobile  Adnexa/parametria:     Rt: Without masses or tenderness.   Lt: Without masses or tenderness.  Anus and perineum: Normal  Digital rectal exam: Normal sphincter tone without palpated masses or tenderness  Assessment/Plan:  48 y.o. MWF G2, P2 for annual exam with no complaints.  Monthly cycle/vasectomy  Plan:  Congratulated on 6 pound weight loss, encouraged to continue healthy lifestyle and decrease calorie/carbs.  Menopause reviewed, denies any symptoms.  SBEs, exercise, calcium rich foods, vitamin D 1000 daily encouraged.  CBC, CMP, lipid panel, Pap normal 2019, new screening guidelines reviewed.Huel Cote [facility], 11:25 AM 03/18/2019

## 2019-03-18 NOTE — Patient Instructions (Signed)
Health Maintenance, Female Adopting a healthy lifestyle and getting preventive care are important in promoting health and wellness. Ask your health care provider about:  The right schedule for you to have regular tests and exams.  Things you can do on your own to prevent diseases and keep yourself healthy. What should I know about diet, weight, and exercise? Eat a healthy diet   Eat a diet that includes plenty of vegetables, fruits, low-fat dairy products, and lean protein.  Do not eat a lot of foods that are high in solid fats, added sugars, or sodium. Maintain a healthy weight Body mass index (BMI) is used to identify weight problems. It estimates body fat based on height and weight. Your health care provider can help determine your BMI and help you achieve or maintain a healthy weight. Get regular exercise Get regular exercise. This is one of the most important things you can do for your health. Most adults should:  Exercise for at least 150 minutes each week. The exercise should increase your heart rate and make you sweat (moderate-intensity exercise).  Do strengthening exercises at least twice a week. This is in addition to the moderate-intensity exercise.  Spend less time sitting. Even light physical activity can be beneficial. Watch cholesterol and blood lipids Have your blood tested for lipids and cholesterol at 49 years of age, then have this test every 5 years. Have your cholesterol levels checked more often if:  Your lipid or cholesterol levels are high.  You are older than 49 years of age.  You are at high risk for heart disease. What should I know about cancer screening? Depending on your health history and family history, you may need to have cancer screening at various ages. This may include screening for:  Breast cancer.  Cervical cancer.  Colorectal cancer.  Skin cancer.  Lung cancer. What should I know about heart disease, diabetes, and high blood  pressure? Blood pressure and heart disease  High blood pressure causes heart disease and increases the risk of stroke. This is more likely to develop in people who have high blood pressure readings, are of African descent, or are overweight.  Have your blood pressure checked: ? Every 3-5 years if you are 18-39 years of age. ? Every year if you are 40 years old or older. Diabetes Have regular diabetes screenings. This checks your fasting blood sugar level. Have the screening done:  Once every three years after age 40 if you are at a normal weight and have a low risk for diabetes.  More often and at a younger age if you are overweight or have a high risk for diabetes. What should I know about preventing infection? Hepatitis B If you have a higher risk for hepatitis B, you should be screened for this virus. Talk with your health care provider to find out if you are at risk for hepatitis B infection. Hepatitis C Testing is recommended for:  Everyone born from 1945 through 1965.  Anyone with known risk factors for hepatitis C. Sexually transmitted infections (STIs)  Get screened for STIs, including gonorrhea and chlamydia, if: ? You are sexually active and are younger than 49 years of age. ? You are older than 49 years of age and your health care provider tells you that you are at risk for this type of infection. ? Your sexual activity has changed since you were last screened, and you are at increased risk for chlamydia or gonorrhea. Ask your health care provider if   you are at risk.  Ask your health care provider about whether you are at high risk for HIV. Your health care provider may recommend a prescription medicine to help prevent HIV infection. If you choose to take medicine to prevent HIV, you should first get tested for HIV. You should then be tested every 3 months for as long as you are taking the medicine. Pregnancy  If you are about to stop having your period (premenopausal) and  you may become pregnant, seek counseling before you get pregnant.  Take 400 to 800 micrograms (mcg) of folic acid every day if you become pregnant.  Ask for birth control (contraception) if you want to prevent pregnancy. Osteoporosis and menopause Osteoporosis is a disease in which the bones lose minerals and strength with aging. This can result in bone fractures. If you are 65 years old or older, or if you are at risk for osteoporosis and fractures, ask your health care provider if you should:  Be screened for bone loss.  Take a calcium or vitamin D supplement to lower your risk of fractures.  Be given hormone replacement therapy (HRT) to treat symptoms of menopause. Follow these instructions at home: Lifestyle  Do not use any products that contain nicotine or tobacco, such as cigarettes, e-cigarettes, and chewing tobacco. If you need help quitting, ask your health care provider.  Do not use street drugs.  Do not share needles.  Ask your health care provider for help if you need support or information about quitting drugs. Alcohol use  Do not drink alcohol if: ? Your health care provider tells you not to drink. ? You are pregnant, may be pregnant, or are planning to become pregnant.  If you drink alcohol: ? Limit how much you use to 0-1 drink a day. ? Limit intake if you are breastfeeding.  Be aware of how much alcohol is in your drink. In the [place] one drink equals one 12 oz bottle of beer (355 mL), one 5 oz glass of wine (148 mL), or one 1 oz glass of hard liquor (44 mL). General instructions  Schedule regular health, dental, and eye exams.  Stay current with your vaccines.  Tell your health care provider if: ? You often feel depressed. ? You have ever been abused or do not feel safe at home. Summary  Adopting a healthy lifestyle and getting preventive care are important in promoting health and wellness.  Follow your health care provider's instructions about healthy  diet, exercising, and getting tested or screened for diseases.  Follow your health care provider's instructions on monitoring your cholesterol and blood pressure. This information is not intended to replace advice given to you by your health care provider. Make sure you discuss any questions you have with your health care provider. Document Released: 02/28/2011 Document Revised: 08/08/2018 Document Reviewed: 08/08/2018 Elsevier Patient Education  2020 Elsevier Inc.  

## 2019-03-19 LAB — COMPREHENSIVE METABOLIC PANEL
AG Ratio: 1.6 (calc) (ref 1.0–2.5)
ALT: 15 U/L (ref 6–29)
AST: 22 U/L (ref 10–35)
Albumin: 3.9 g/dL (ref 3.6–5.1)
Alkaline phosphatase (APISO): 72 U/L (ref 31–125)
BUN: 14 mg/dL (ref 7–25)
CO2: 29 mmol/L (ref 20–32)
Calcium: 9.2 mg/dL (ref 8.6–10.2)
Chloride: 103 mmol/L (ref 98–110)
Creat: 0.74 mg/dL (ref 0.50–1.10)
Globulin: 2.5 g/dL (calc) (ref 1.9–3.7)
Glucose, Bld: 78 mg/dL (ref 65–99)
Potassium: 4.3 mmol/L (ref 3.5–5.3)
Sodium: 139 mmol/L (ref 135–146)
Total Bilirubin: 0.6 mg/dL (ref 0.2–1.2)
Total Protein: 6.4 g/dL (ref 6.1–8.1)

## 2019-03-19 LAB — CBC WITH DIFFERENTIAL/PLATELET
Absolute Monocytes: 403 cells/uL (ref 200–950)
Basophils Absolute: 48 cells/uL (ref 0–200)
Basophils Relative: 1 %
Eosinophils Absolute: 62 cells/uL (ref 15–500)
Eosinophils Relative: 1.3 %
HCT: 40.5 % (ref 35.0–45.0)
Hemoglobin: 13.1 g/dL (ref 11.7–15.5)
Lymphs Abs: 1738 cells/uL (ref 850–3900)
MCH: 27.9 pg (ref 27.0–33.0)
MCHC: 32.3 g/dL (ref 32.0–36.0)
MCV: 86.2 fL (ref 80.0–100.0)
MPV: 10.9 fL (ref 7.5–12.5)
Monocytes Relative: 8.4 %
Neutro Abs: 2549 cells/uL (ref 1500–7800)
Neutrophils Relative %: 53.1 %
Platelets: 277 10*3/uL (ref 140–400)
RBC: 4.7 10*6/uL (ref 3.80–5.10)
RDW: 13.5 % (ref 11.0–15.0)
Total Lymphocyte: 36.2 %
WBC: 4.8 10*3/uL (ref 3.8–10.8)

## 2019-03-19 LAB — LIPID PANEL
Cholesterol: 237 mg/dL — ABNORMAL HIGH (ref <200)
HDL: 72 mg/dL (ref >=50)
LDL Cholesterol (Calc): 146 mg/dL (calc) — ABNORMAL HIGH
Non-HDL Cholesterol (Calc): 165 mg/dL (calc) — ABNORMAL HIGH (ref <130)
Total CHOL/HDL Ratio: 3.3 (calc) (ref <5.0)
Triglycerides: 89 mg/dL (ref <150)

## 2019-04-07 DIAGNOSIS — Z1159 Encounter for screening for other viral diseases: Secondary | ICD-10-CM | POA: Diagnosis not present

## 2019-07-15 DIAGNOSIS — Z713 Dietary counseling and surveillance: Secondary | ICD-10-CM | POA: Diagnosis not present

## 2019-07-15 DIAGNOSIS — Z6828 Body mass index (BMI) 28.0-28.9, adult: Secondary | ICD-10-CM | POA: Diagnosis not present

## 2019-07-15 DIAGNOSIS — E663 Overweight: Secondary | ICD-10-CM | POA: Diagnosis not present

## 2019-07-16 DIAGNOSIS — Z0100 Encounter for examination of eyes and vision without abnormal findings: Secondary | ICD-10-CM | POA: Diagnosis not present

## 2020-01-06 DIAGNOSIS — Z6828 Body mass index (BMI) 28.0-28.9, adult: Secondary | ICD-10-CM | POA: Diagnosis not present

## 2020-01-06 DIAGNOSIS — Z713 Dietary counseling and surveillance: Secondary | ICD-10-CM | POA: Diagnosis not present

## 2020-01-06 DIAGNOSIS — E663 Overweight: Secondary | ICD-10-CM | POA: Diagnosis not present

## 2020-01-28 ENCOUNTER — Other Ambulatory Visit: Payer: Self-pay | Admitting: Women's Health

## 2020-01-28 ENCOUNTER — Other Ambulatory Visit: Payer: Self-pay | Admitting: Obstetrics

## 2020-01-28 DIAGNOSIS — Z1231 Encounter for screening mammogram for malignant neoplasm of breast: Secondary | ICD-10-CM

## 2020-01-29 DIAGNOSIS — R8290 Unspecified abnormal findings in urine: Secondary | ICD-10-CM | POA: Diagnosis not present

## 2020-01-29 DIAGNOSIS — R39198 Other difficulties with micturition: Secondary | ICD-10-CM | POA: Diagnosis not present

## 2020-01-29 DIAGNOSIS — R300 Dysuria: Secondary | ICD-10-CM | POA: Diagnosis not present

## 2020-01-29 DIAGNOSIS — Z87442 Personal history of urinary calculi: Secondary | ICD-10-CM | POA: Diagnosis not present

## 2020-02-06 DIAGNOSIS — N390 Urinary tract infection, site not specified: Secondary | ICD-10-CM | POA: Diagnosis not present

## 2020-02-06 DIAGNOSIS — Z87442 Personal history of urinary calculi: Secondary | ICD-10-CM | POA: Diagnosis not present

## 2020-03-04 ENCOUNTER — Ambulatory Visit
Admission: RE | Admit: 2020-03-04 | Discharge: 2020-03-04 | Disposition: A | Discharge status: Home / Self Care | Payer: BC Managed Care – PPO | Source: Ambulatory Visit | Attending: Obstetrics | Admitting: Obstetrics

## 2020-03-04 ENCOUNTER — Other Ambulatory Visit: Payer: Self-pay

## 2020-03-04 DIAGNOSIS — Z1231 Encounter for screening mammogram for malignant neoplasm of breast: Secondary | ICD-10-CM

## 2020-03-24 DIAGNOSIS — Z01419 Encounter for gynecological examination (general) (routine) without abnormal findings: Secondary | ICD-10-CM | POA: Diagnosis not present

## 2020-03-24 DIAGNOSIS — Z6830 Body mass index (BMI) 30.0-30.9, adult: Secondary | ICD-10-CM | POA: Diagnosis not present

## 2020-07-03 DIAGNOSIS — Z713 Dietary counseling and surveillance: Secondary | ICD-10-CM | POA: Diagnosis not present

## 2020-07-03 DIAGNOSIS — E663 Overweight: Secondary | ICD-10-CM | POA: Diagnosis not present

## 2020-07-03 DIAGNOSIS — Z6828 Body mass index (BMI) 28.0-28.9, adult: Secondary | ICD-10-CM | POA: Diagnosis not present

## 2020-09-17 DIAGNOSIS — Z0100 Encounter for examination of eyes and vision without abnormal findings: Secondary | ICD-10-CM | POA: Diagnosis not present

## 2020-12-16 DIAGNOSIS — N898 Other specified noninflammatory disorders of vagina: Secondary | ICD-10-CM | POA: Diagnosis not present

## 2020-12-16 DIAGNOSIS — N390 Urinary tract infection, site not specified: Secondary | ICD-10-CM | POA: Diagnosis not present

## 2020-12-23 DIAGNOSIS — K635 Polyp of colon: Secondary | ICD-10-CM | POA: Diagnosis not present

## 2020-12-23 DIAGNOSIS — K5730 Diverticulosis of large intestine without perforation or abscess without bleeding: Secondary | ICD-10-CM | POA: Diagnosis not present

## 2020-12-23 DIAGNOSIS — Z1211 Encounter for screening for malignant neoplasm of colon: Secondary | ICD-10-CM | POA: Diagnosis not present

## 2021-01-11 DIAGNOSIS — Z6830 Body mass index (BMI) 30.0-30.9, adult: Secondary | ICD-10-CM | POA: Diagnosis not present

## 2021-01-11 DIAGNOSIS — E669 Obesity, unspecified: Secondary | ICD-10-CM | POA: Diagnosis not present

## 2021-01-11 DIAGNOSIS — Z713 Dietary counseling and surveillance: Secondary | ICD-10-CM | POA: Diagnosis not present

## 2021-01-11 DIAGNOSIS — H9193 Unspecified hearing loss, bilateral: Secondary | ICD-10-CM | POA: Diagnosis not present

## 2021-02-01 ENCOUNTER — Other Ambulatory Visit: Payer: Self-pay | Admitting: Obstetrics

## 2021-02-01 DIAGNOSIS — Z1231 Encounter for screening mammogram for malignant neoplasm of breast: Secondary | ICD-10-CM

## 2021-02-17 DIAGNOSIS — H903 Sensorineural hearing loss, bilateral: Secondary | ICD-10-CM | POA: Diagnosis not present

## 2021-03-31 ENCOUNTER — Ambulatory Visit: Payer: BC Managed Care – PPO

## 2021-04-21 DIAGNOSIS — Z01419 Encounter for gynecological examination (general) (routine) without abnormal findings: Secondary | ICD-10-CM | POA: Diagnosis not present

## 2021-04-21 DIAGNOSIS — Z6832 Body mass index (BMI) 32.0-32.9, adult: Secondary | ICD-10-CM | POA: Diagnosis not present

## 2021-04-21 DIAGNOSIS — Z6830 Body mass index (BMI) 30.0-30.9, adult: Secondary | ICD-10-CM | POA: Diagnosis not present

## 2021-04-21 DIAGNOSIS — Z124 Encounter for screening for malignant neoplasm of cervix: Secondary | ICD-10-CM | POA: Diagnosis not present

## 2021-04-21 DIAGNOSIS — E669 Obesity, unspecified: Secondary | ICD-10-CM | POA: Diagnosis not present

## 2021-04-21 DIAGNOSIS — Z713 Dietary counseling and surveillance: Secondary | ICD-10-CM | POA: Diagnosis not present

## 2021-04-21 DIAGNOSIS — Z79899 Other long term (current) drug therapy: Secondary | ICD-10-CM | POA: Diagnosis not present

## 2021-04-21 DIAGNOSIS — Z13220 Encounter for screening for lipoid disorders: Secondary | ICD-10-CM | POA: Diagnosis not present

## 2021-04-21 DIAGNOSIS — Z113 Encounter for screening for infections with a predominantly sexual mode of transmission: Secondary | ICD-10-CM | POA: Diagnosis not present

## 2021-05-18 ENCOUNTER — Ambulatory Visit
Admission: RE | Admit: 2021-05-18 | Discharge: 2021-05-18 | Disposition: A | Discharge status: Home / Self Care | Payer: BC Managed Care – PPO | Source: Ambulatory Visit | Attending: Obstetrics | Admitting: Obstetrics

## 2021-05-18 ENCOUNTER — Other Ambulatory Visit: Payer: Self-pay

## 2021-05-18 DIAGNOSIS — Z1231 Encounter for screening mammogram for malignant neoplasm of breast: Secondary | ICD-10-CM | POA: Diagnosis not present

## 2021-08-10 DIAGNOSIS — Z713 Dietary counseling and surveillance: Secondary | ICD-10-CM | POA: Diagnosis not present

## 2021-08-10 DIAGNOSIS — R7303 Prediabetes: Secondary | ICD-10-CM | POA: Diagnosis not present

## 2021-08-10 DIAGNOSIS — Z6830 Body mass index (BMI) 30.0-30.9, adult: Secondary | ICD-10-CM | POA: Diagnosis not present

## 2021-08-10 DIAGNOSIS — E669 Obesity, unspecified: Secondary | ICD-10-CM | POA: Diagnosis not present

## 2021-10-11 DIAGNOSIS — R9389 Abnormal findings on diagnostic imaging of other specified body structures: Secondary | ICD-10-CM | POA: Diagnosis not present

## 2021-10-11 DIAGNOSIS — Z3202 Encounter for pregnancy test, result negative: Secondary | ICD-10-CM | POA: Diagnosis not present

## 2021-10-11 DIAGNOSIS — N920 Excessive and frequent menstruation with regular cycle: Secondary | ICD-10-CM | POA: Diagnosis not present

## 2021-10-21 DIAGNOSIS — Z0100 Encounter for examination of eyes and vision without abnormal findings: Secondary | ICD-10-CM | POA: Diagnosis not present

## 2021-11-08 DIAGNOSIS — Z6830 Body mass index (BMI) 30.0-30.9, adult: Secondary | ICD-10-CM | POA: Diagnosis not present

## 2021-11-08 DIAGNOSIS — E669 Obesity, unspecified: Secondary | ICD-10-CM | POA: Diagnosis not present

## 2021-11-08 DIAGNOSIS — R7303 Prediabetes: Secondary | ICD-10-CM | POA: Diagnosis not present

## 2021-11-08 DIAGNOSIS — Z713 Dietary counseling and surveillance: Secondary | ICD-10-CM | POA: Diagnosis not present

## 2021-12-10 DIAGNOSIS — R9389 Abnormal findings on diagnostic imaging of other specified body structures: Secondary | ICD-10-CM | POA: Diagnosis not present

## 2021-12-10 DIAGNOSIS — Z3202 Encounter for pregnancy test, result negative: Secondary | ICD-10-CM | POA: Diagnosis not present

## 2021-12-10 DIAGNOSIS — N946 Dysmenorrhea, unspecified: Secondary | ICD-10-CM | POA: Diagnosis not present

## 2022-01-18 IMAGING — MG MM DIGITAL SCREENING BILAT W/ TOMO AND CAD
8 series · 8 of 24 positions shown · non-contrast
Comparison: Previous exam(s).

CLINICAL DATA: Screening.

EXAM:
DIGITAL SCREENING BILATERAL MAMMOGRAM WITH TOMOSYNTHESIS AND CAD
TECHNIQUE: Bilateral screening digital craniocaudal and mediolateral oblique
mammograms were obtained. Bilateral screening digital breast
tomosynthesis was performed. The images were evaluated with
computer-aided detection.

[L CC synth-2D]
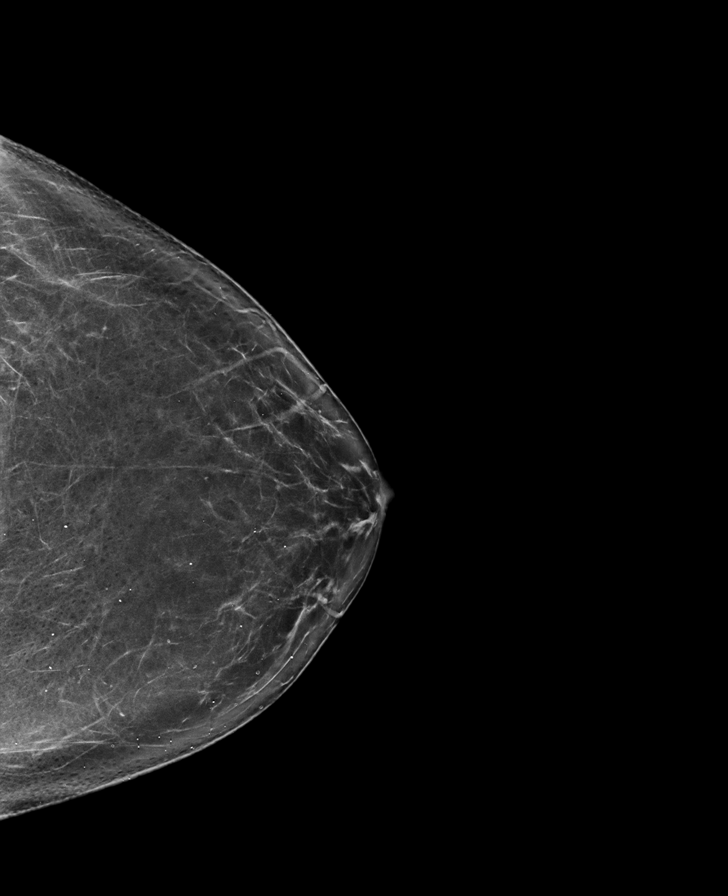

[R MLO synth-2D]
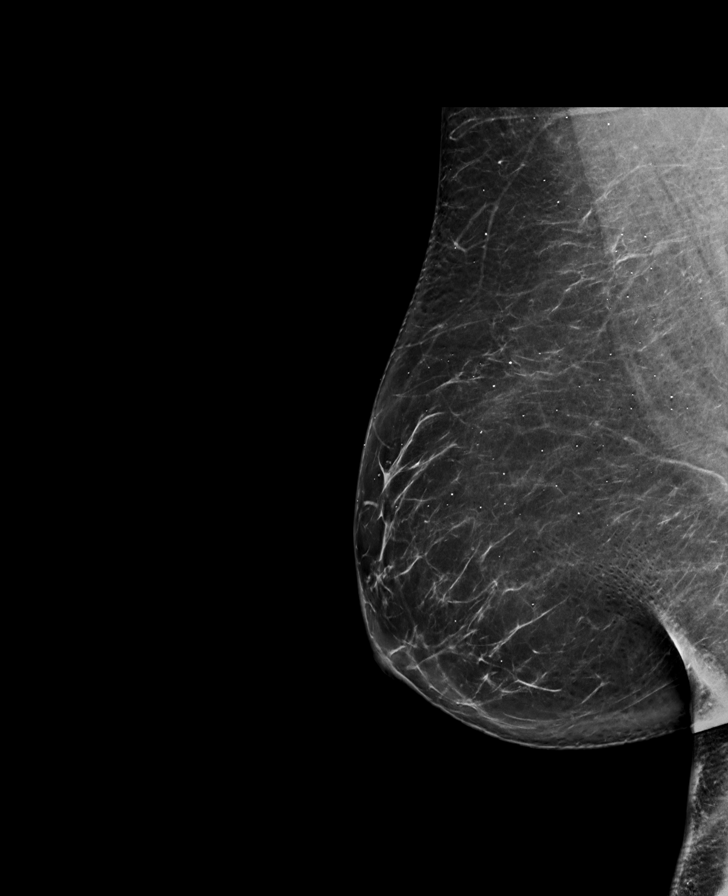

[L MLO synth-2D]
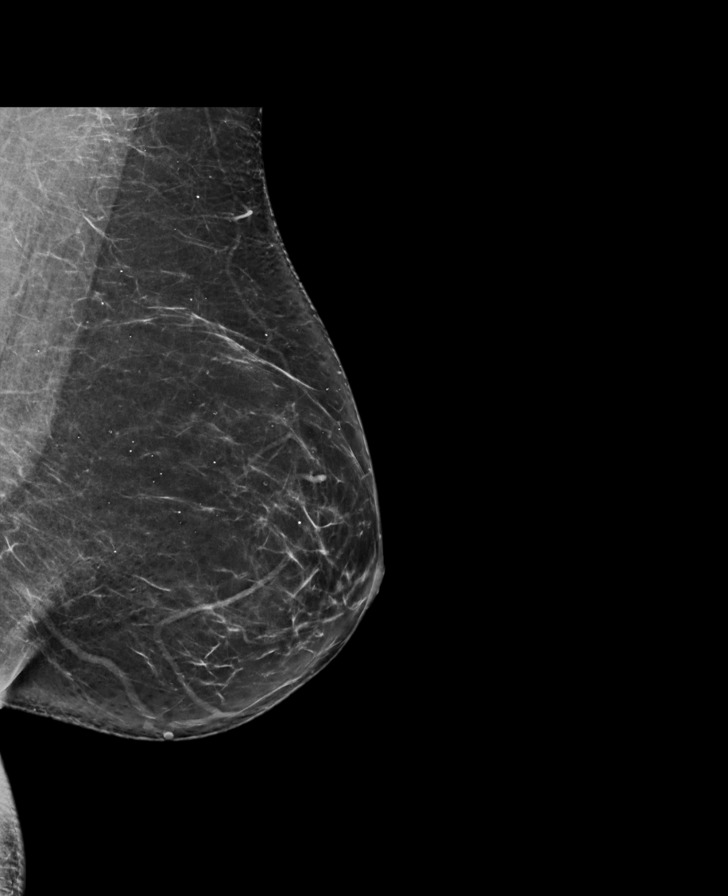

[R CC synth-2D]
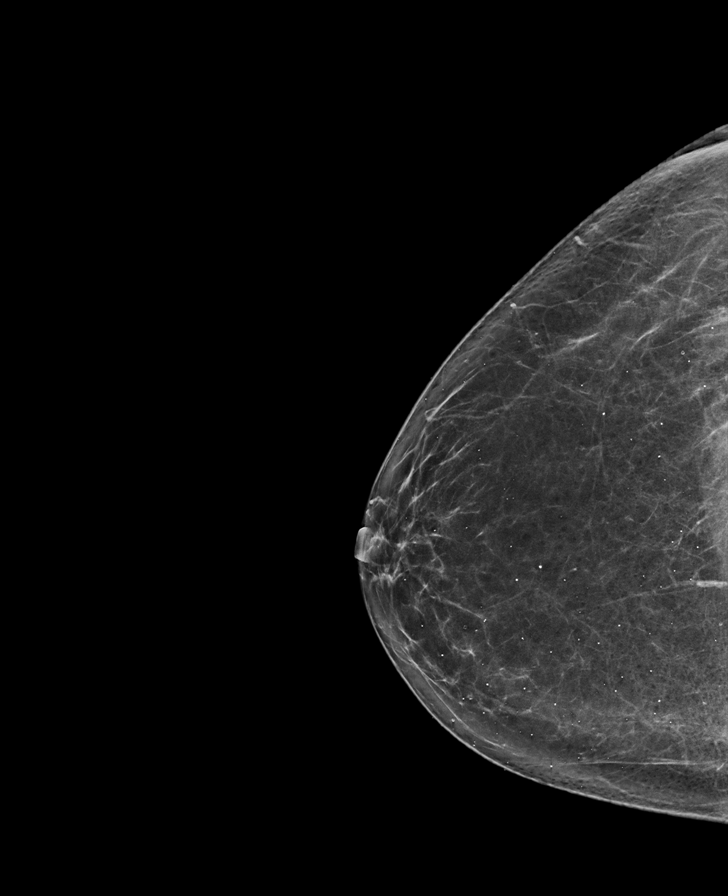

[R MLO tomo · tomo slice 43/86.0]
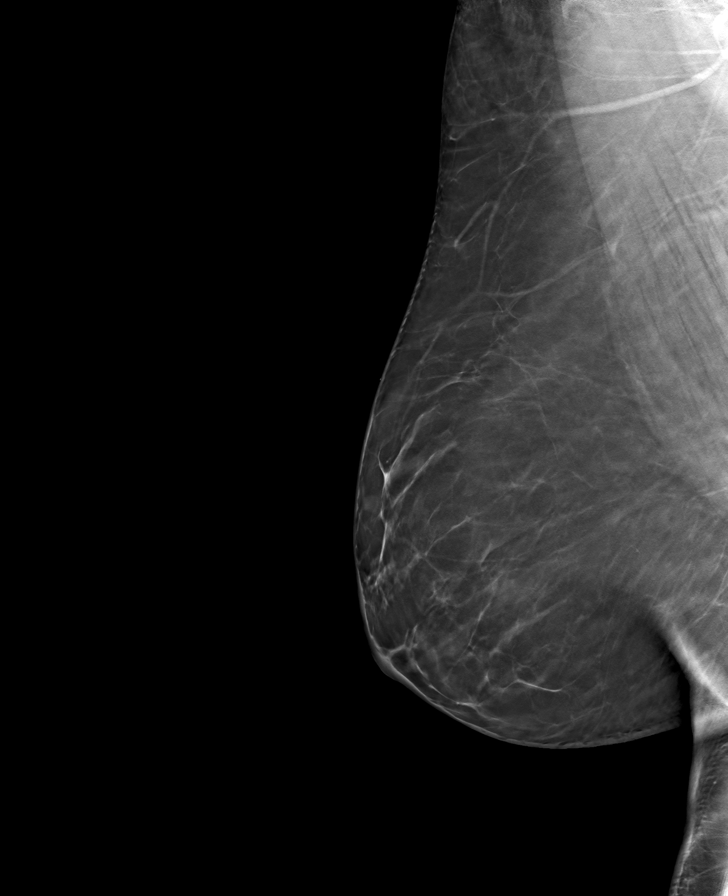

[L MLO tomo · tomo slice 41/81.0]
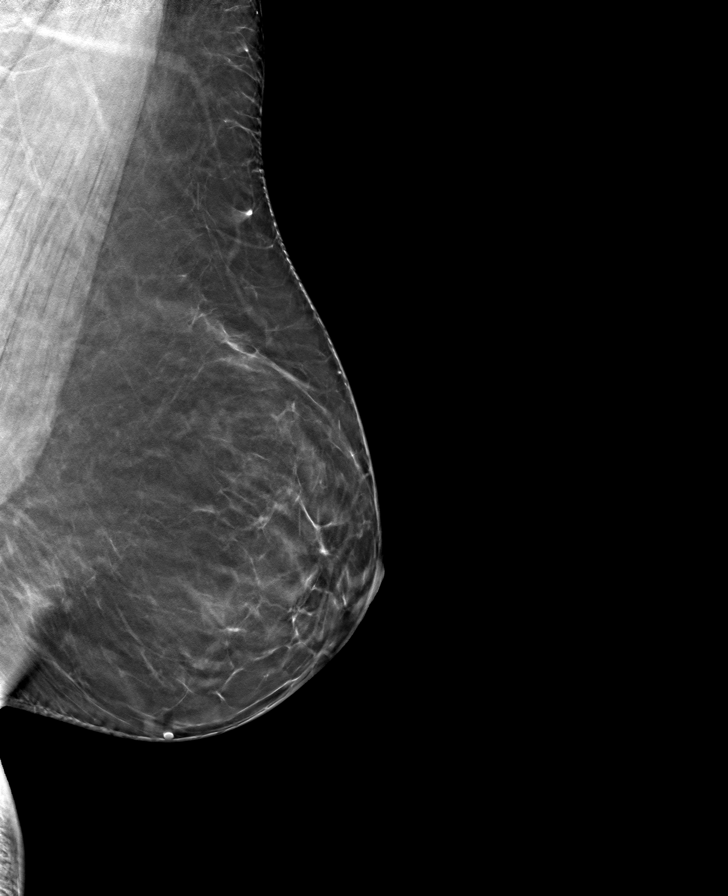

[R CC tomo · tomo slice 39/76.0]
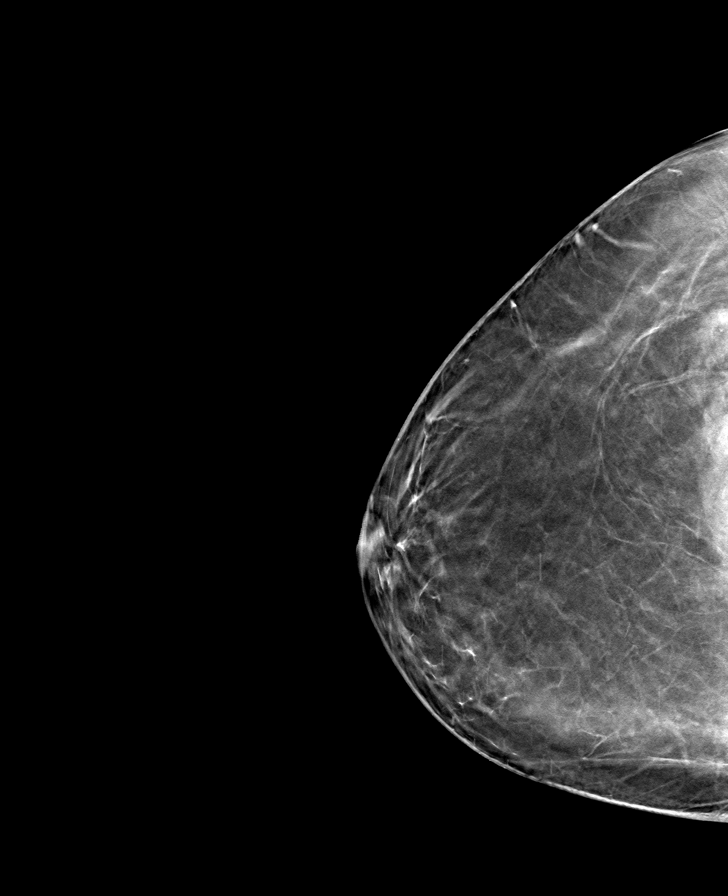

[L CC tomo · tomo slice 39/76.0]
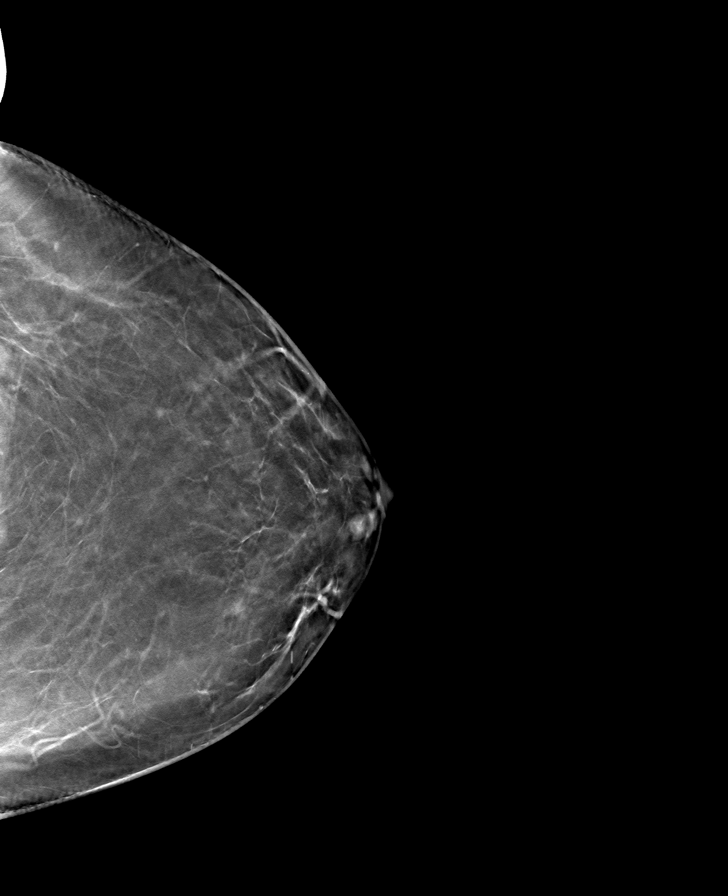

[8 of 24 positions shown; findings below may reference images not displayed]

ACR Breast Density Category b: There are scattered areas of
fibroglandular density.
FINDINGS: There are no findings suspicious for malignancy.
IMPRESSION: No mammographic evidence of malignancy. A result letter of this
screening mammogram will be mailed directly to the patient.

RECOMMENDATION:
Screening mammogram in one year. (Code:[id number])

BI-RADS CATEGORY  1: Negative.

## 2022-03-09 DIAGNOSIS — E785 Hyperlipidemia, unspecified: Secondary | ICD-10-CM | POA: Diagnosis not present

## 2022-03-09 DIAGNOSIS — N951 Menopausal and female climacteric states: Secondary | ICD-10-CM | POA: Diagnosis not present

## 2022-03-09 DIAGNOSIS — R0602 Shortness of breath: Secondary | ICD-10-CM | POA: Diagnosis not present

## 2022-03-09 DIAGNOSIS — R5383 Other fatigue: Secondary | ICD-10-CM | POA: Diagnosis not present

## 2022-03-09 DIAGNOSIS — E611 Iron deficiency: Secondary | ICD-10-CM | POA: Diagnosis not present

## 2022-03-09 DIAGNOSIS — Z1389 Encounter for screening for other disorder: Secondary | ICD-10-CM | POA: Diagnosis not present

## 2022-03-09 DIAGNOSIS — R7303 Prediabetes: Secondary | ICD-10-CM | POA: Diagnosis not present

## 2022-03-09 DIAGNOSIS — Z9189 Other specified personal risk factors, not elsewhere classified: Secondary | ICD-10-CM | POA: Diagnosis not present

## 2022-03-28 DIAGNOSIS — D509 Iron deficiency anemia, unspecified: Secondary | ICD-10-CM | POA: Diagnosis not present

## 2022-03-28 DIAGNOSIS — E785 Hyperlipidemia, unspecified: Secondary | ICD-10-CM | POA: Diagnosis not present

## 2022-03-28 DIAGNOSIS — N951 Menopausal and female climacteric states: Secondary | ICD-10-CM | POA: Diagnosis not present

## 2022-03-28 DIAGNOSIS — R7303 Prediabetes: Secondary | ICD-10-CM | POA: Diagnosis not present

## 2022-04-06 DIAGNOSIS — Z1231 Encounter for screening mammogram for malignant neoplasm of breast: Secondary | ICD-10-CM | POA: Diagnosis not present

## 2022-04-12 DIAGNOSIS — R7303 Prediabetes: Secondary | ICD-10-CM | POA: Diagnosis not present

## 2022-04-12 DIAGNOSIS — D509 Iron deficiency anemia, unspecified: Secondary | ICD-10-CM | POA: Diagnosis not present

## 2022-04-12 DIAGNOSIS — R948 Abnormal results of function studies of other organs and systems: Secondary | ICD-10-CM | POA: Diagnosis not present

## 2022-04-12 DIAGNOSIS — N951 Menopausal and female climacteric states: Secondary | ICD-10-CM | POA: Diagnosis not present

## 2022-04-12 DIAGNOSIS — E785 Hyperlipidemia, unspecified: Secondary | ICD-10-CM | POA: Diagnosis not present

## 2022-05-09 DIAGNOSIS — D509 Iron deficiency anemia, unspecified: Secondary | ICD-10-CM | POA: Diagnosis not present

## 2022-05-09 DIAGNOSIS — R7303 Prediabetes: Secondary | ICD-10-CM | POA: Diagnosis not present

## 2022-05-09 DIAGNOSIS — R948 Abnormal results of function studies of other organs and systems: Secondary | ICD-10-CM | POA: Diagnosis not present

## 2022-05-09 DIAGNOSIS — N951 Menopausal and female climacteric states: Secondary | ICD-10-CM | POA: Diagnosis not present

## 2022-05-23 DIAGNOSIS — R7303 Prediabetes: Secondary | ICD-10-CM | POA: Diagnosis not present

## 2022-05-23 DIAGNOSIS — Z9189 Other specified personal risk factors, not elsewhere classified: Secondary | ICD-10-CM | POA: Diagnosis not present

## 2022-05-23 DIAGNOSIS — N951 Menopausal and female climacteric states: Secondary | ICD-10-CM | POA: Diagnosis not present

## 2022-05-23 DIAGNOSIS — E669 Obesity, unspecified: Secondary | ICD-10-CM | POA: Diagnosis not present

## 2022-05-30 ENCOUNTER — Other Ambulatory Visit (HOSPITAL_COMMUNITY): Payer: Self-pay

## 2022-05-30 MED ORDER — WEGOVY 0.25 MG/0.5ML ~~LOC~~ SOAJ
0.2500 mg | SUBCUTANEOUS | 0 refills | Status: AC
  Filled 2022-05-30 – 2022-06-08 (×3): qty 2, 28d supply, fill #0

## 2022-06-02 ENCOUNTER — Other Ambulatory Visit (HOSPITAL_COMMUNITY): Payer: Self-pay

## 2022-06-03 ENCOUNTER — Other Ambulatory Visit (HOSPITAL_COMMUNITY): Payer: Self-pay

## 2022-06-08 ENCOUNTER — Other Ambulatory Visit (HOSPITAL_COMMUNITY): Payer: Self-pay

## 2022-06-14 DIAGNOSIS — R7303 Prediabetes: Secondary | ICD-10-CM | POA: Diagnosis not present

## 2022-06-14 DIAGNOSIS — Z9189 Other specified personal risk factors, not elsewhere classified: Secondary | ICD-10-CM | POA: Diagnosis not present

## 2022-06-14 DIAGNOSIS — N951 Menopausal and female climacteric states: Secondary | ICD-10-CM | POA: Diagnosis not present

## 2022-06-14 DIAGNOSIS — D509 Iron deficiency anemia, unspecified: Secondary | ICD-10-CM | POA: Diagnosis not present

## 2022-07-04 DIAGNOSIS — R7303 Prediabetes: Secondary | ICD-10-CM | POA: Diagnosis not present

## 2022-07-04 DIAGNOSIS — R948 Abnormal results of function studies of other organs and systems: Secondary | ICD-10-CM | POA: Diagnosis not present

## 2022-07-04 DIAGNOSIS — N951 Menopausal and female climacteric states: Secondary | ICD-10-CM | POA: Diagnosis not present

## 2022-07-04 DIAGNOSIS — D509 Iron deficiency anemia, unspecified: Secondary | ICD-10-CM | POA: Diagnosis not present

## 2022-08-02 DIAGNOSIS — N951 Menopausal and female climacteric states: Secondary | ICD-10-CM | POA: Diagnosis not present

## 2022-08-02 DIAGNOSIS — R948 Abnormal results of function studies of other organs and systems: Secondary | ICD-10-CM | POA: Diagnosis not present

## 2022-08-02 DIAGNOSIS — E669 Obesity, unspecified: Secondary | ICD-10-CM | POA: Diagnosis not present

## 2022-08-02 DIAGNOSIS — R7303 Prediabetes: Secondary | ICD-10-CM | POA: Diagnosis not present

## 2022-08-18 ENCOUNTER — Other Ambulatory Visit (HOSPITAL_COMMUNITY): Payer: Self-pay

## 2022-08-23 ENCOUNTER — Other Ambulatory Visit (HOSPITAL_COMMUNITY): Payer: Self-pay

## 2022-08-23 MED ORDER — WEGOVY 1 MG/0.5ML ~~LOC~~ SOAJ
1.0000 mg | SUBCUTANEOUS | 0 refills | Status: AC
  Filled 2022-08-23: qty 2, 28d supply, fill #0

## 2022-08-31 ENCOUNTER — Other Ambulatory Visit (HOSPITAL_COMMUNITY): Payer: Self-pay

## 2022-09-01 ENCOUNTER — Other Ambulatory Visit (HOSPITAL_COMMUNITY): Payer: Self-pay

## 2022-09-05 DIAGNOSIS — K5903 Drug induced constipation: Secondary | ICD-10-CM | POA: Diagnosis not present

## 2022-09-05 DIAGNOSIS — R948 Abnormal results of function studies of other organs and systems: Secondary | ICD-10-CM | POA: Diagnosis not present

## 2022-09-05 DIAGNOSIS — Z9189 Other specified personal risk factors, not elsewhere classified: Secondary | ICD-10-CM | POA: Diagnosis not present

## 2022-09-05 DIAGNOSIS — N951 Menopausal and female climacteric states: Secondary | ICD-10-CM | POA: Diagnosis not present

## 2022-09-05 DIAGNOSIS — E669 Obesity, unspecified: Secondary | ICD-10-CM | POA: Diagnosis not present

## 2022-10-04 DIAGNOSIS — R948 Abnormal results of function studies of other organs and systems: Secondary | ICD-10-CM | POA: Diagnosis not present

## 2022-10-04 DIAGNOSIS — D508 Other iron deficiency anemias: Secondary | ICD-10-CM | POA: Diagnosis not present

## 2022-10-04 DIAGNOSIS — N951 Menopausal and female climacteric states: Secondary | ICD-10-CM | POA: Diagnosis not present

## 2022-11-02 DIAGNOSIS — N951 Menopausal and female climacteric states: Secondary | ICD-10-CM | POA: Diagnosis not present

## 2022-11-02 DIAGNOSIS — E669 Obesity, unspecified: Secondary | ICD-10-CM | POA: Diagnosis not present

## 2022-11-02 DIAGNOSIS — R948 Abnormal results of function studies of other organs and systems: Secondary | ICD-10-CM | POA: Diagnosis not present

## 2022-11-02 DIAGNOSIS — Z6830 Body mass index (BMI) 30.0-30.9, adult: Secondary | ICD-10-CM | POA: Diagnosis not present

## 2023-01-03 DIAGNOSIS — N951 Menopausal and female climacteric states: Secondary | ICD-10-CM | POA: Diagnosis not present

## 2023-01-03 DIAGNOSIS — R948 Abnormal results of function studies of other organs and systems: Secondary | ICD-10-CM | POA: Diagnosis not present

## 2023-02-20 DIAGNOSIS — R300 Dysuria: Secondary | ICD-10-CM | POA: Diagnosis not present

## 2023-02-20 DIAGNOSIS — N3001 Acute cystitis with hematuria: Secondary | ICD-10-CM | POA: Diagnosis not present

## 2023-02-28 DIAGNOSIS — N951 Menopausal and female climacteric states: Secondary | ICD-10-CM | POA: Diagnosis not present

## 2023-02-28 DIAGNOSIS — E669 Obesity, unspecified: Secondary | ICD-10-CM | POA: Diagnosis not present

## 2023-02-28 DIAGNOSIS — R948 Abnormal results of function studies of other organs and systems: Secondary | ICD-10-CM | POA: Diagnosis not present

## 2023-03-01 DIAGNOSIS — N390 Urinary tract infection, site not specified: Secondary | ICD-10-CM | POA: Diagnosis not present

## 2023-04-21 DIAGNOSIS — Z1231 Encounter for screening mammogram for malignant neoplasm of breast: Secondary | ICD-10-CM | POA: Diagnosis not present

## 2023-04-24 DIAGNOSIS — Z6830 Body mass index (BMI) 30.0-30.9, adult: Secondary | ICD-10-CM | POA: Diagnosis not present

## 2023-04-24 DIAGNOSIS — R948 Abnormal results of function studies of other organs and systems: Secondary | ICD-10-CM | POA: Diagnosis not present

## 2023-04-24 DIAGNOSIS — D508 Other iron deficiency anemias: Secondary | ICD-10-CM | POA: Diagnosis not present

## 2023-04-24 DIAGNOSIS — N951 Menopausal and female climacteric states: Secondary | ICD-10-CM | POA: Diagnosis not present

## 2023-04-26 DIAGNOSIS — N6321 Unspecified lump in the left breast, upper outer quadrant: Secondary | ICD-10-CM | POA: Diagnosis not present

## 2023-04-28 HISTORY — PX: BREAST LUMPECTOMY: SHX2

## 2023-05-08 DIAGNOSIS — C50412 Malignant neoplasm of upper-outer quadrant of left female breast: Secondary | ICD-10-CM | POA: Diagnosis not present

## 2023-05-08 DIAGNOSIS — N6320 Unspecified lump in the left breast, unspecified quadrant: Secondary | ICD-10-CM | POA: Diagnosis not present

## 2023-05-08 DIAGNOSIS — N6321 Unspecified lump in the left breast, upper outer quadrant: Secondary | ICD-10-CM | POA: Diagnosis not present

## 2023-05-08 DIAGNOSIS — Z170 Estrogen receptor positive status [ER+]: Secondary | ICD-10-CM | POA: Diagnosis not present

## 2023-05-16 DIAGNOSIS — C50512 Malignant neoplasm of lower-outer quadrant of left female breast: Secondary | ICD-10-CM | POA: Diagnosis not present

## 2023-05-16 DIAGNOSIS — Z170 Estrogen receptor positive status [ER+]: Secondary | ICD-10-CM | POA: Diagnosis not present

## 2023-05-17 DIAGNOSIS — Z170 Estrogen receptor positive status [ER+]: Secondary | ICD-10-CM | POA: Diagnosis not present

## 2023-05-17 DIAGNOSIS — C50512 Malignant neoplasm of lower-outer quadrant of left female breast: Secondary | ICD-10-CM | POA: Diagnosis not present

## 2023-05-27 DIAGNOSIS — Z0100 Encounter for examination of eyes and vision without abnormal findings: Secondary | ICD-10-CM | POA: Diagnosis not present

## 2023-05-29 DIAGNOSIS — Z170 Estrogen receptor positive status [ER+]: Secondary | ICD-10-CM | POA: Diagnosis not present

## 2023-05-29 DIAGNOSIS — Z880 Allergy status to penicillin: Secondary | ICD-10-CM | POA: Diagnosis not present

## 2023-05-29 DIAGNOSIS — Z9012 Acquired absence of left breast and nipple: Secondary | ICD-10-CM | POA: Diagnosis not present

## 2023-05-29 DIAGNOSIS — D0512 Intraductal carcinoma in situ of left breast: Secondary | ICD-10-CM | POA: Diagnosis not present

## 2023-05-29 DIAGNOSIS — Z803 Family history of malignant neoplasm of breast: Secondary | ICD-10-CM | POA: Diagnosis not present

## 2023-05-29 DIAGNOSIS — C50912 Malignant neoplasm of unspecified site of left female breast: Secondary | ICD-10-CM | POA: Diagnosis not present

## 2023-05-29 DIAGNOSIS — C50512 Malignant neoplasm of lower-outer quadrant of left female breast: Secondary | ICD-10-CM | POA: Diagnosis not present

## 2023-06-12 ENCOUNTER — Other Ambulatory Visit: Payer: Self-pay | Admitting: Radiation Oncology

## 2023-06-12 ENCOUNTER — Telehealth: Payer: Self-pay | Admitting: Radiation Oncology

## 2023-06-12 ENCOUNTER — Inpatient Hospital Stay
Admission: RE | Admit: 2023-06-12 | Discharge: 2023-06-12 | Disposition: A | Discharge status: Home / Self Care | Payer: Self-pay | Source: Ambulatory Visit | Attending: Radiation Oncology | Admitting: Radiation Oncology

## 2023-06-12 DIAGNOSIS — C50512 Malignant neoplasm of lower-outer quadrant of left female breast: Secondary | ICD-10-CM

## 2023-06-12 DIAGNOSIS — Z170 Estrogen receptor positive status [ER+]: Secondary | ICD-10-CM

## 2023-06-12 NOTE — Progress Notes (Signed)
Location of Breast Cancer:   Histology per Pathology Report:    Receptor Status: ER(90%), PR (99%), Her2-neu (1 +) , Ki-67()  Did patient present with symptoms (if so, please note symptoms) or was this found on screening mammography?: screening mammogram  Past/Anticipated interventions by surgeon, if any: Teressa Senter MD at 05/29/2023  Procedures  PARTIAL MASTECTOMY  PARTIAL MASTECTOMY  BIOPSY/REMOVAL LYMPH NODES  IO MAP OF SENT LYMPH NODE  LEFT BREAST NEEDLE LOCALIZED LUMPECTOMY WITH LEFT SENTINEL LYMPH NODE  BIOPSY      Past/Anticipated interventions by medical oncology, if any:  Dr. Rosezella Rumpf 05-16-23   Lymphedema issues, if any:  none to report  Pain issues, if any:  pt still tender at incision area  SAFETY ISSUES: Prior radiation? no Pacemaker/ICD? no Possible current pregnancy?no Is the patient on methotrexate? no  Current Complaints / other details:  No questions or concerns at this time. Wants to know plan and side effects.   Vitals:   06/16/23 0834  BP: 139/77  Pulse: 97  Resp: 18  Temp: (!) 97.1 F (36.2 C)  SpO2: 100%

## 2023-06-12 NOTE — Telephone Encounter (Signed)
Called patient to schedule a consultation w. Dr. Basilio Cairo. No answer, LVM for a return call.

## 2023-06-15 NOTE — Progress Notes (Signed)
Radiation Oncology         [phone] ________________________________  Initial Outpatient Consultation  Name: Angelica Holt MRN: [id number]  Date: 06/16/2023  DOB: 05-01-1971  WU:[id number], Misty Stanley, MD  Tonita Cong, MD   REFERRING PHYSICIAN: Tonita Cong, MD  DIAGNOSIS: [id number]   ICD-10-CM   1. Malignant neoplasm of lower-outer quadrant of left breast of female, estrogen receptor positive (HCC)  C50.512 Pregnancy, urine   Z17.0     2. Carcinoma of upper-outer quadrant of left breast in female, estrogen receptor positive (HCC)  C50.412    Z17.0        Stage IA (pT1b pN0) Left Breast, LOQ, Invasive ductal carcinoma with intermediate grade DCIS, ER+ / PR+ / Her2- by IHC, Grade 1: s/p left breast lumpectomy a SLN evaluation with clear margins and negative nodes  CHIEF COMPLAINT: Here to discuss management of left breast cancer  HISTORY OF PRESENT ILLNESS::Angelica Holt is a 52 y.o. female who presented for a bilateral screening mammogram on 04/25/23 which showed a possible lesion in the left breast. No symptoms, if any, were reported at that time. Left breast ultrasound on 04/26/23 further revealed a mass in the outer left breast measuring 4 mm. No evidence of left axillary lymphadenopathy was appreciated.   Biopsy of the left breast mass on date of 05/08/23 showed grade 1 invasive ductal carcinoma.  ER status: 90% positive; PR status 99% positive; Her2 status 1+ negative by IHC; Grade 1. No lymph nodes were examined.   She was then referred to Dr. Charise Carwin at [facility] and opted to proceed with a left breast lumpectomy with SLN biopsies on 05/29/23. Pathology from the procedure revealed: tumor the size of 0.6 cm; histology of grade 1 invasive ductal carcinoma with intermediate grade DCIS; all margins negative for invasive and in situ carcinoma; margin status to invasive disease of 5 mm from the anterior margin; margin status to in situ disease of 5 mm  from the closest margin; nodal statu sof 1/1 left axillary sentinel lymph node negative for carcinoma. ER status: 90% positive; PR status 99% positive; Her2 status 1+ negative by IHC; Grade 1.  She was also seen in consultation by Dr. Neil Crouch with [facility] medical oncology on 05/16/23. She will follow-up with Dr. Neil Crouch in the near future to discuss antiestrogen treatment options.      PREVIOUS RADIATION THERAPY: No  PAST MEDICAL HISTORY:  has no past medical history on file.    PAST SURGICAL HISTORY: Past Surgical History:  Procedure Laterality Date   BREAST LUMPECTOMY Left 04/28/2023   WISDOM TOOTH EXTRACTION  08/30/1991    FAMILY HISTORY: family history includes Breast cancer (age of onset: 72) in her paternal grandmother; Cancer in her maternal grandfather; Diabetes in her maternal grandmother; Heart disease in her father and maternal grandfather; Hypertension in her maternal grandmother.  SOCIAL HISTORY:  reports that she has never smoked. She has never used smokeless tobacco. She reports current alcohol use. She reports that she does not use drugs.  ALLERGIES: Penicillins  MEDICATIONS:  Current Outpatient Medications  Medication Sig Dispense Refill   buPROPion (WELLBUTRIN XL) 300 MG 24 hr tablet Take 300 mg by mouth daily.     ferrous sulfate 325 (65 FE) MG tablet Take 325 mg by mouth daily. Three times a week     Multiple Vitamins-Minerals (MULTIVITAMIN ADULT PO) Take by mouth.     phentermine (ADIPEX-P) 37.5 MG tablet Take 37.5 mg by mouth daily.  0   Semaglutide-Weight  Management (WEGOVY) 0.25 MG/0.5ML SOAJ Inject 0.25 mg into the skin once a week. (Patient not taking: Reported on 06/16/2023) 2 mL 0   Semaglutide-Weight Management (WEGOVY) 1 MG/0.5ML SOAJ Inject 1 mg into the skin once a week. 2 mL 0   No current facility-administered medications for this encounter.    REVIEW OF SYSTEMS: As above in HPI.   PHYSICAL EXAM:  height is 5\' 5"  (1.651 m) and weight is 187 lb 6 oz  (85 kg). Her temporal temperature is 97.1 F (36.2 C) (abnormal). Her blood pressure is 139/77 and her pulse is 97. Her respiration is 18 and oxygen saturation is 100%.   General: Alert and oriented, in no acute distress HEENT: Head is normocephalic. Extraocular movements are intact.   Neck: Neck is supple, no palpable cervical or supraclavicular lymphadenopathy. Heart: Regular in rate and rhythm with no murmurs, rubs, or gallops. Chest: Clear to auscultation bilaterally, with no rhonchi, wheezes, or rales. Abdomen: Soft, nontender, nondistended, with no rigidity or guarding. Extremities: No cyanosis or edema. Lymphatics: see Neck Exam Skin: No concerning lesions. Musculoskeletal: symmetric strength and muscle tone throughout. Neurologic: Cranial nerves II through XII are grossly intact. No obvious focalities. Speech is fluent. Coordination is intact. Psychiatric: Judgment and insight are intact. Affect is appropriate. Breasts: Seroma palpated in left upper outer breast.  Left axillary and left lateral breast lumpectomy scars are healing well..    ECOG = 0  0 - Asymptomatic (Fully active, able to carry on all predisease activities without restriction)  1 - Symptomatic but completely ambulatory (Restricted in physically strenuous activity but ambulatory and able to carry out work of a light or sedentary nature. For example, light housework, office work)  2 - Symptomatic, <50% in bed during the day (Ambulatory and capable of all self care but unable to carry out any work activities. Up and about more than 50% of waking hours)  3 - Symptomatic, >50% in bed, but not bedbound (Capable of only limited self-care, confined to bed or chair 50% or more of waking hours)  4 - Bedbound (Completely disabled. Cannot carry on any self-care. Totally confined to bed or chair)  5 - Death   Santiago Glad MM, Creech RH, Tormey DC, et al. [phone]). "Toxicity and response criteria of the [facility]  [facility]". Am. Evlyn Clines. Oncol. 5 (6): 649-55   LABORATORY DATA:  Lab Results  Component Value Date   WBC 4.8 03/18/2019   HGB 13.1 03/18/2019   HCT 40.5 03/18/2019   MCV 86.2 03/18/2019   PLT 277 03/18/2019   CMP     Component Value Date/Time   NA 139 03/18/2019 1158   K 4.3 03/18/2019 1158   CL 103 03/18/2019 1158   CO2 29 03/18/2019 1158   GLUCOSE 78 03/18/2019 1158   BUN 14 03/18/2019 1158   CREATININE 0.74 03/18/2019 1158   CALCIUM 9.2 03/18/2019 1158   PROT 6.4 03/18/2019 1158   ALBUMIN 4.1 02/20/2017 1153   AST 22 03/18/2019 1158   ALT 15 03/18/2019 1158   ALKPHOS 73 02/20/2017 1153   BILITOT 0.6 03/18/2019 1158         RADIOGRAPHY: As above    IMPRESSION/PLAN: This is a very pleasant 52 year old woman with ER positive stage I left breast cancer, status post lumpectomy.  It was a pleasure meeting the patient today. We discussed the risks, benefits, and side effects of radiotherapy. I recommend radiotherapy to the left breast to reduce her risk of locoregional recurrence by  2/3.  We discussed that radiation would take approximately 4 weeks to complete and that I would give the patient 1 more week to heal and to see her medical oncologist before we start treatment planning.  She is scheduled to see her medical oncologist early next week so we will arrange CT simulation soon thereafter.  I reviewed the logistics, benefits, risks, and potential side effects of this treatment in detail. Risks may include but not necessary be limited to acute and late injury tissue in the radiation fields such as skin irritation (change in color/pigmentation, itching, dryness, pain, peeling). She may experience fatigue. We also discussed possible risk of long term cosmetic changes or scar tissue. There is also a smaller risk for lung toxicity,  cardiac toxicity,  brachial plexopathy,  lymphedema,  musculoskeletal changes,  rib fragility or  induction of a second malignancy,  late chronic  non-healing soft tissue wound.    The patient asked good questions which I answered to her satisfaction. She is enthusiastic about proceeding with treatment. A consent form has been  signed and placed in her chart.  I look forward to participating in her care.  On date of service, in total, I spent 45 minutes on this encounter. Patient was seen in person.   __________________________________________   Lonie Peak, MD  This document serves as a record of services personally performed by Lonie Peak, MD. It was created on her behalf by Neena Rhymes, a trained medical scribe. The creation of this record is based on the scribe's personal observations and the provider's statements to them. This document has been checked and approved by the attending provider.

## 2023-06-16 ENCOUNTER — Ambulatory Visit
Admission: RE | Admit: 2023-06-16 | Discharge: 2023-06-16 | Disposition: A | Discharge status: Home / Self Care | Payer: BC Managed Care – PPO | Source: Ambulatory Visit | Attending: Radiation Oncology | Admitting: Radiation Oncology

## 2023-06-16 ENCOUNTER — Encounter: Payer: Self-pay | Admitting: Radiation Oncology

## 2023-06-16 VITALS — BP 139/77 | HR 97 | Temp 97.1°F | Resp 18 | Ht 65.0 in | Wt 187.4 lb

## 2023-06-16 DIAGNOSIS — C50512 Malignant neoplasm of lower-outer quadrant of left female breast: Secondary | ICD-10-CM

## 2023-06-16 DIAGNOSIS — Z170 Estrogen receptor positive status [ER+]: Secondary | ICD-10-CM

## 2023-06-16 DIAGNOSIS — C50412 Malignant neoplasm of upper-outer quadrant of left female breast: Secondary | ICD-10-CM | POA: Diagnosis not present

## 2023-06-16 DIAGNOSIS — Z803 Family history of malignant neoplasm of breast: Secondary | ICD-10-CM | POA: Insufficient documentation

## 2023-06-16 DIAGNOSIS — Z79899 Other long term (current) drug therapy: Secondary | ICD-10-CM | POA: Diagnosis not present

## 2023-06-16 LAB — PREGNANCY, URINE: Preg Test, Ur: NEGATIVE

## 2023-06-20 DIAGNOSIS — R7309 Other abnormal glucose: Secondary | ICD-10-CM | POA: Diagnosis not present

## 2023-06-20 DIAGNOSIS — Z170 Estrogen receptor positive status [ER+]: Secondary | ICD-10-CM | POA: Diagnosis not present

## 2023-06-20 DIAGNOSIS — R948 Abnormal results of function studies of other organs and systems: Secondary | ICD-10-CM | POA: Diagnosis not present

## 2023-06-20 DIAGNOSIS — N951 Menopausal and female climacteric states: Secondary | ICD-10-CM | POA: Diagnosis not present

## 2023-06-20 DIAGNOSIS — C50512 Malignant neoplasm of lower-outer quadrant of left female breast: Secondary | ICD-10-CM | POA: Diagnosis not present

## 2023-06-20 DIAGNOSIS — D508 Other iron deficiency anemias: Secondary | ICD-10-CM | POA: Diagnosis not present

## 2023-06-26 ENCOUNTER — Ambulatory Visit
Admission: RE | Admit: 2023-06-26 | Discharge: 2023-06-26 | Disposition: A | Discharge status: Home / Self Care | Payer: BC Managed Care – PPO | Source: Ambulatory Visit | Attending: Radiation Oncology | Admitting: Radiation Oncology

## 2023-06-26 DIAGNOSIS — Z170 Estrogen receptor positive status [ER+]: Secondary | ICD-10-CM | POA: Diagnosis not present

## 2023-06-26 DIAGNOSIS — C50512 Malignant neoplasm of lower-outer quadrant of left female breast: Secondary | ICD-10-CM | POA: Diagnosis not present

## 2023-06-26 DIAGNOSIS — C50412 Malignant neoplasm of upper-outer quadrant of left female breast: Secondary | ICD-10-CM | POA: Diagnosis not present

## 2023-07-07 DIAGNOSIS — Z170 Estrogen receptor positive status [ER+]: Secondary | ICD-10-CM | POA: Diagnosis not present

## 2023-07-07 DIAGNOSIS — C50412 Malignant neoplasm of upper-outer quadrant of left female breast: Secondary | ICD-10-CM | POA: Insufficient documentation

## 2023-07-07 DIAGNOSIS — C50512 Malignant neoplasm of lower-outer quadrant of left female breast: Secondary | ICD-10-CM | POA: Diagnosis not present

## 2023-07-10 ENCOUNTER — Other Ambulatory Visit: Payer: Self-pay

## 2023-07-10 ENCOUNTER — Ambulatory Visit
Admission: RE | Admit: 2023-07-10 | Discharge: 2023-07-10 | Disposition: A | Discharge status: Home / Self Care | Payer: BC Managed Care – PPO | Source: Ambulatory Visit | Attending: Radiation Oncology | Admitting: Radiation Oncology

## 2023-07-10 ENCOUNTER — Ambulatory Visit
Admission: RE | Admit: 2023-07-10 | Discharge: 2023-07-10 | Disposition: A | Discharge status: Home / Self Care | Payer: BC Managed Care – PPO | Source: Ambulatory Visit | Attending: Radiation Oncology

## 2023-07-10 DIAGNOSIS — Z170 Estrogen receptor positive status [ER+]: Secondary | ICD-10-CM | POA: Diagnosis not present

## 2023-07-10 DIAGNOSIS — C50512 Malignant neoplasm of lower-outer quadrant of left female breast: Secondary | ICD-10-CM | POA: Diagnosis not present

## 2023-07-10 DIAGNOSIS — Z510 Encounter for antineoplastic radiation therapy: Secondary | ICD-10-CM | POA: Diagnosis not present

## 2023-07-10 DIAGNOSIS — C50412 Malignant neoplasm of upper-outer quadrant of left female breast: Secondary | ICD-10-CM | POA: Diagnosis not present

## 2023-07-10 LAB — RAD ONC ARIA SESSION SUMMARY
Course Elapsed Days: 0
Plan Fractions Treated to Date: 1
Plan Prescribed Dose Per Fraction: 2.67 Gy
Plan Total Fractions Prescribed: 15
Plan Total Prescribed Dose: 40.05 Gy
Reference Point Dosage Given to Date: 2.67 Gy
Reference Point Session Dosage Given: 2.67 Gy
Session Number: 1

## 2023-07-10 MED ORDER — RADIAPLEXRX EX GEL: Freq: Once | CUTANEOUS | Status: AC

## 2023-07-10 MED ORDER — ALRA NON-METALLIC DEODORANT (RAD-ONC)
1.0000 | Freq: Once | TOPICAL | Status: AC
  Administered 2023-07-10: 1 via TOPICAL

## 2023-07-11 ENCOUNTER — Ambulatory Visit
Admission: RE | Admit: 2023-07-11 | Discharge: 2023-07-11 | Discharge status: Home / Self Care | Payer: BC Managed Care – PPO | Source: Ambulatory Visit | Attending: Radiation Oncology

## 2023-07-11 ENCOUNTER — Other Ambulatory Visit: Payer: Self-pay

## 2023-07-11 DIAGNOSIS — Z510 Encounter for antineoplastic radiation therapy: Secondary | ICD-10-CM | POA: Diagnosis not present

## 2023-07-11 DIAGNOSIS — Z170 Estrogen receptor positive status [ER+]: Secondary | ICD-10-CM | POA: Diagnosis not present

## 2023-07-11 DIAGNOSIS — C50512 Malignant neoplasm of lower-outer quadrant of left female breast: Secondary | ICD-10-CM | POA: Diagnosis not present

## 2023-07-11 DIAGNOSIS — C50412 Malignant neoplasm of upper-outer quadrant of left female breast: Secondary | ICD-10-CM | POA: Diagnosis not present

## 2023-07-11 LAB — RAD ONC ARIA SESSION SUMMARY
Course Elapsed Days: 1
Plan Fractions Treated to Date: 2
Plan Prescribed Dose Per Fraction: 2.67 Gy
Plan Total Fractions Prescribed: 15
Plan Total Prescribed Dose: 40.05 Gy
Reference Point Dosage Given to Date: 5.34 Gy
Reference Point Session Dosage Given: 2.67 Gy
Session Number: 2

## 2023-07-12 ENCOUNTER — Other Ambulatory Visit: Payer: Self-pay

## 2023-07-12 ENCOUNTER — Ambulatory Visit
Admission: RE | Admit: 2023-07-12 | Discharge: 2023-07-12 | Discharge status: Home / Self Care | Payer: BC Managed Care – PPO | Source: Ambulatory Visit | Attending: Radiation Oncology

## 2023-07-12 DIAGNOSIS — C50412 Malignant neoplasm of upper-outer quadrant of left female breast: Secondary | ICD-10-CM | POA: Diagnosis not present

## 2023-07-12 DIAGNOSIS — Z170 Estrogen receptor positive status [ER+]: Secondary | ICD-10-CM | POA: Diagnosis not present

## 2023-07-12 DIAGNOSIS — C50512 Malignant neoplasm of lower-outer quadrant of left female breast: Secondary | ICD-10-CM | POA: Diagnosis not present

## 2023-07-12 DIAGNOSIS — Z510 Encounter for antineoplastic radiation therapy: Secondary | ICD-10-CM | POA: Diagnosis not present

## 2023-07-12 LAB — RAD ONC ARIA SESSION SUMMARY
Course Elapsed Days: 2
Plan Fractions Treated to Date: 3
Plan Prescribed Dose Per Fraction: 2.67 Gy
Plan Total Fractions Prescribed: 15
Plan Total Prescribed Dose: 40.05 Gy
Reference Point Dosage Given to Date: 8.01 Gy
Reference Point Session Dosage Given: 2.67 Gy
Session Number: 3

## 2023-07-13 ENCOUNTER — Ambulatory Visit
Admission: RE | Admit: 2023-07-13 | Discharge: 2023-07-13 | Disposition: A | Discharge status: Home / Self Care | Payer: BC Managed Care – PPO | Source: Ambulatory Visit | Attending: Radiation Oncology | Admitting: Radiation Oncology

## 2023-07-13 ENCOUNTER — Other Ambulatory Visit: Payer: Self-pay

## 2023-07-13 DIAGNOSIS — C50512 Malignant neoplasm of lower-outer quadrant of left female breast: Secondary | ICD-10-CM | POA: Diagnosis not present

## 2023-07-13 DIAGNOSIS — C50412 Malignant neoplasm of upper-outer quadrant of left female breast: Secondary | ICD-10-CM | POA: Diagnosis not present

## 2023-07-13 DIAGNOSIS — Z170 Estrogen receptor positive status [ER+]: Secondary | ICD-10-CM | POA: Diagnosis not present

## 2023-07-13 DIAGNOSIS — Z510 Encounter for antineoplastic radiation therapy: Secondary | ICD-10-CM | POA: Diagnosis not present

## 2023-07-13 LAB — RAD ONC ARIA SESSION SUMMARY
Course Elapsed Days: 3
Plan Fractions Treated to Date: 4
Plan Prescribed Dose Per Fraction: 2.67 Gy
Plan Total Fractions Prescribed: 15
Plan Total Prescribed Dose: 40.05 Gy
Reference Point Dosage Given to Date: 10.68 Gy
Reference Point Session Dosage Given: 2.67 Gy
Session Number: 4

## 2023-07-14 ENCOUNTER — Ambulatory Visit
Admission: RE | Admit: 2023-07-14 | Discharge: 2023-07-14 | Disposition: A | Discharge status: Home / Self Care | Payer: BC Managed Care – PPO | Source: Ambulatory Visit | Attending: Radiation Oncology | Admitting: Radiation Oncology

## 2023-07-14 ENCOUNTER — Other Ambulatory Visit: Payer: Self-pay

## 2023-07-14 DIAGNOSIS — Z170 Estrogen receptor positive status [ER+]: Secondary | ICD-10-CM | POA: Diagnosis not present

## 2023-07-14 DIAGNOSIS — C50412 Malignant neoplasm of upper-outer quadrant of left female breast: Secondary | ICD-10-CM | POA: Diagnosis not present

## 2023-07-14 DIAGNOSIS — C50512 Malignant neoplasm of lower-outer quadrant of left female breast: Secondary | ICD-10-CM | POA: Diagnosis not present

## 2023-07-14 DIAGNOSIS — Z510 Encounter for antineoplastic radiation therapy: Secondary | ICD-10-CM | POA: Diagnosis not present

## 2023-07-14 LAB — RAD ONC ARIA SESSION SUMMARY
Course Elapsed Days: 4
Plan Fractions Treated to Date: 5
Plan Prescribed Dose Per Fraction: 2.67 Gy
Plan Total Fractions Prescribed: 15
Plan Total Prescribed Dose: 40.05 Gy
Reference Point Dosage Given to Date: 13.35 Gy
Reference Point Session Dosage Given: 2.67 Gy
Session Number: 5

## 2023-07-17 ENCOUNTER — Ambulatory Visit
Admission: RE | Admit: 2023-07-17 | Discharge: 2023-07-17 | Disposition: A | Discharge status: Home / Self Care | Payer: BC Managed Care – PPO | Source: Ambulatory Visit | Attending: Radiation Oncology | Admitting: Radiation Oncology

## 2023-07-17 ENCOUNTER — Ambulatory Visit
Admission: RE | Admit: 2023-07-17 | Discharge: 2023-07-17 | Disposition: A | Discharge status: Home / Self Care | Payer: BC Managed Care – PPO | Source: Ambulatory Visit | Attending: Radiation Oncology

## 2023-07-17 ENCOUNTER — Other Ambulatory Visit: Payer: Self-pay

## 2023-07-17 DIAGNOSIS — Z170 Estrogen receptor positive status [ER+]: Secondary | ICD-10-CM | POA: Diagnosis not present

## 2023-07-17 DIAGNOSIS — C50412 Malignant neoplasm of upper-outer quadrant of left female breast: Secondary | ICD-10-CM | POA: Diagnosis not present

## 2023-07-17 DIAGNOSIS — C50512 Malignant neoplasm of lower-outer quadrant of left female breast: Secondary | ICD-10-CM | POA: Diagnosis not present

## 2023-07-17 DIAGNOSIS — Z510 Encounter for antineoplastic radiation therapy: Secondary | ICD-10-CM | POA: Diagnosis not present

## 2023-07-17 LAB — RAD ONC ARIA SESSION SUMMARY
Course Elapsed Days: 7
Plan Fractions Treated to Date: 6
Plan Prescribed Dose Per Fraction: 2.67 Gy
Plan Total Fractions Prescribed: 15
Plan Total Prescribed Dose: 40.05 Gy
Reference Point Dosage Given to Date: 16.02 Gy
Reference Point Session Dosage Given: 2.67 Gy
Session Number: 6

## 2023-07-18 ENCOUNTER — Ambulatory Visit
Admission: RE | Admit: 2023-07-18 | Discharge: 2023-07-18 | Disposition: A | Discharge status: Home / Self Care | Payer: BC Managed Care – PPO | Source: Ambulatory Visit | Attending: Radiation Oncology | Admitting: Radiation Oncology

## 2023-07-18 ENCOUNTER — Other Ambulatory Visit: Payer: Self-pay

## 2023-07-18 DIAGNOSIS — Z170 Estrogen receptor positive status [ER+]: Secondary | ICD-10-CM | POA: Diagnosis not present

## 2023-07-18 DIAGNOSIS — C50412 Malignant neoplasm of upper-outer quadrant of left female breast: Secondary | ICD-10-CM | POA: Diagnosis not present

## 2023-07-18 DIAGNOSIS — C50512 Malignant neoplasm of lower-outer quadrant of left female breast: Secondary | ICD-10-CM | POA: Diagnosis not present

## 2023-07-18 DIAGNOSIS — Z510 Encounter for antineoplastic radiation therapy: Secondary | ICD-10-CM | POA: Diagnosis not present

## 2023-07-18 LAB — RAD ONC ARIA SESSION SUMMARY
Course Elapsed Days: 8
Plan Fractions Treated to Date: 7
Plan Prescribed Dose Per Fraction: 2.67 Gy
Plan Total Fractions Prescribed: 15
Plan Total Prescribed Dose: 40.05 Gy
Reference Point Dosage Given to Date: 18.69 Gy
Reference Point Session Dosage Given: 2.67 Gy
Session Number: 7

## 2023-07-19 ENCOUNTER — Ambulatory Visit
Admission: RE | Admit: 2023-07-19 | Discharge: 2023-07-19 | Disposition: A | Discharge status: Home / Self Care | Payer: BC Managed Care – PPO | Source: Ambulatory Visit | Attending: Radiation Oncology

## 2023-07-19 ENCOUNTER — Other Ambulatory Visit: Payer: Self-pay

## 2023-07-19 DIAGNOSIS — C50512 Malignant neoplasm of lower-outer quadrant of left female breast: Secondary | ICD-10-CM | POA: Diagnosis not present

## 2023-07-19 DIAGNOSIS — Z510 Encounter for antineoplastic radiation therapy: Secondary | ICD-10-CM | POA: Diagnosis not present

## 2023-07-19 DIAGNOSIS — C50412 Malignant neoplasm of upper-outer quadrant of left female breast: Secondary | ICD-10-CM | POA: Diagnosis not present

## 2023-07-19 DIAGNOSIS — Z170 Estrogen receptor positive status [ER+]: Secondary | ICD-10-CM | POA: Diagnosis not present

## 2023-07-19 LAB — RAD ONC ARIA SESSION SUMMARY
Course Elapsed Days: 9
Plan Fractions Treated to Date: 8
Plan Prescribed Dose Per Fraction: 2.67 Gy
Plan Total Fractions Prescribed: 15
Plan Total Prescribed Dose: 40.05 Gy
Reference Point Dosage Given to Date: 21.36 Gy
Reference Point Session Dosage Given: 2.67 Gy
Session Number: 8

## 2023-07-20 ENCOUNTER — Ambulatory Visit
Admission: RE | Admit: 2023-07-20 | Discharge: 2023-07-20 | Disposition: A | Discharge status: Home / Self Care | Payer: BC Managed Care – PPO | Source: Ambulatory Visit | Attending: Radiation Oncology | Admitting: Radiation Oncology

## 2023-07-20 ENCOUNTER — Other Ambulatory Visit: Payer: Self-pay

## 2023-07-20 DIAGNOSIS — C50412 Malignant neoplasm of upper-outer quadrant of left female breast: Secondary | ICD-10-CM | POA: Diagnosis not present

## 2023-07-20 DIAGNOSIS — Z510 Encounter for antineoplastic radiation therapy: Secondary | ICD-10-CM | POA: Diagnosis not present

## 2023-07-20 DIAGNOSIS — C50512 Malignant neoplasm of lower-outer quadrant of left female breast: Secondary | ICD-10-CM | POA: Diagnosis not present

## 2023-07-20 DIAGNOSIS — Z170 Estrogen receptor positive status [ER+]: Secondary | ICD-10-CM | POA: Diagnosis not present

## 2023-07-20 LAB — RAD ONC ARIA SESSION SUMMARY
Course Elapsed Days: 10
Plan Fractions Treated to Date: 9
Plan Prescribed Dose Per Fraction: 2.67 Gy
Plan Total Fractions Prescribed: 15
Plan Total Prescribed Dose: 40.05 Gy
Reference Point Dosage Given to Date: 24.03 Gy
Reference Point Session Dosage Given: 2.67 Gy
Session Number: 9

## 2023-07-21 ENCOUNTER — Other Ambulatory Visit: Payer: Self-pay

## 2023-07-21 ENCOUNTER — Ambulatory Visit
Admission: RE | Admit: 2023-07-21 | Discharge: 2023-07-21 | Disposition: A | Discharge status: Home / Self Care | Payer: BC Managed Care – PPO | Source: Ambulatory Visit | Attending: Radiation Oncology

## 2023-07-21 DIAGNOSIS — Z170 Estrogen receptor positive status [ER+]: Secondary | ICD-10-CM | POA: Diagnosis not present

## 2023-07-21 DIAGNOSIS — C50412 Malignant neoplasm of upper-outer quadrant of left female breast: Secondary | ICD-10-CM | POA: Diagnosis not present

## 2023-07-21 DIAGNOSIS — Z510 Encounter for antineoplastic radiation therapy: Secondary | ICD-10-CM | POA: Diagnosis not present

## 2023-07-21 DIAGNOSIS — C50512 Malignant neoplasm of lower-outer quadrant of left female breast: Secondary | ICD-10-CM | POA: Diagnosis not present

## 2023-07-21 LAB — RAD ONC ARIA SESSION SUMMARY
Course Elapsed Days: 11
Plan Fractions Treated to Date: 10
Plan Prescribed Dose Per Fraction: 2.67 Gy
Plan Total Fractions Prescribed: 15
Plan Total Prescribed Dose: 40.05 Gy
Reference Point Dosage Given to Date: 26.7 Gy
Reference Point Session Dosage Given: 2.67 Gy
Session Number: 10

## 2023-07-24 ENCOUNTER — Other Ambulatory Visit: Payer: Self-pay

## 2023-07-24 ENCOUNTER — Ambulatory Visit
Admission: RE | Admit: 2023-07-24 | Discharge: 2023-07-24 | Disposition: A | Discharge status: Home / Self Care | Payer: BC Managed Care – PPO | Source: Ambulatory Visit | Attending: Radiation Oncology

## 2023-07-24 ENCOUNTER — Ambulatory Visit
Admission: RE | Admit: 2023-07-24 | Discharge: 2023-07-24 | Discharge status: Home / Self Care | Payer: BC Managed Care – PPO | Source: Ambulatory Visit | Attending: Radiation Oncology

## 2023-07-24 DIAGNOSIS — Z170 Estrogen receptor positive status [ER+]: Secondary | ICD-10-CM | POA: Diagnosis not present

## 2023-07-24 DIAGNOSIS — Z510 Encounter for antineoplastic radiation therapy: Secondary | ICD-10-CM | POA: Diagnosis not present

## 2023-07-24 DIAGNOSIS — C50412 Malignant neoplasm of upper-outer quadrant of left female breast: Secondary | ICD-10-CM | POA: Diagnosis not present

## 2023-07-24 DIAGNOSIS — C50512 Malignant neoplasm of lower-outer quadrant of left female breast: Secondary | ICD-10-CM | POA: Diagnosis not present

## 2023-07-24 LAB — RAD ONC ARIA SESSION SUMMARY
Course Elapsed Days: 14
Plan Fractions Treated to Date: 11
Plan Prescribed Dose Per Fraction: 2.67 Gy
Plan Total Fractions Prescribed: 15
Plan Total Prescribed Dose: 40.05 Gy
Reference Point Dosage Given to Date: 29.37 Gy
Reference Point Session Dosage Given: 2.67 Gy
Session Number: 11

## 2023-07-25 ENCOUNTER — Other Ambulatory Visit: Payer: Self-pay

## 2023-07-25 ENCOUNTER — Ambulatory Visit
Admission: RE | Admit: 2023-07-25 | Discharge: 2023-07-25 | Disposition: A | Discharge status: Home / Self Care | Payer: BC Managed Care – PPO | Source: Ambulatory Visit | Attending: Radiation Oncology

## 2023-07-25 DIAGNOSIS — Z510 Encounter for antineoplastic radiation therapy: Secondary | ICD-10-CM | POA: Diagnosis not present

## 2023-07-25 DIAGNOSIS — Z170 Estrogen receptor positive status [ER+]: Secondary | ICD-10-CM | POA: Diagnosis not present

## 2023-07-25 DIAGNOSIS — C50412 Malignant neoplasm of upper-outer quadrant of left female breast: Secondary | ICD-10-CM | POA: Diagnosis not present

## 2023-07-25 DIAGNOSIS — C50512 Malignant neoplasm of lower-outer quadrant of left female breast: Secondary | ICD-10-CM | POA: Diagnosis not present

## 2023-07-25 LAB — RAD ONC ARIA SESSION SUMMARY
Course Elapsed Days: 15
Plan Fractions Treated to Date: 12
Plan Prescribed Dose Per Fraction: 2.67 Gy
Plan Total Fractions Prescribed: 15
Plan Total Prescribed Dose: 40.05 Gy
Reference Point Dosage Given to Date: 32.04 Gy
Reference Point Session Dosage Given: 2.67 Gy
Session Number: 12

## 2023-07-26 ENCOUNTER — Ambulatory Visit
Admission: RE | Admit: 2023-07-26 | Discharge: 2023-07-26 | Disposition: A | Discharge status: Home / Self Care | Payer: BC Managed Care – PPO | Source: Ambulatory Visit | Attending: Radiation Oncology | Admitting: Radiation Oncology

## 2023-07-26 ENCOUNTER — Other Ambulatory Visit: Payer: Self-pay

## 2023-07-26 DIAGNOSIS — Z170 Estrogen receptor positive status [ER+]: Secondary | ICD-10-CM | POA: Diagnosis not present

## 2023-07-26 DIAGNOSIS — C50512 Malignant neoplasm of lower-outer quadrant of left female breast: Secondary | ICD-10-CM | POA: Diagnosis not present

## 2023-07-26 DIAGNOSIS — Z510 Encounter for antineoplastic radiation therapy: Secondary | ICD-10-CM | POA: Diagnosis not present

## 2023-07-26 DIAGNOSIS — C50412 Malignant neoplasm of upper-outer quadrant of left female breast: Secondary | ICD-10-CM | POA: Diagnosis not present

## 2023-07-26 LAB — RAD ONC ARIA SESSION SUMMARY
Course Elapsed Days: 16
Plan Fractions Treated to Date: 13
Plan Prescribed Dose Per Fraction: 2.67 Gy
Plan Total Fractions Prescribed: 15
Plan Total Prescribed Dose: 40.05 Gy
Reference Point Dosage Given to Date: 34.71 Gy
Reference Point Session Dosage Given: 2.67 Gy
Session Number: 13

## 2023-07-31 ENCOUNTER — Ambulatory Visit
Admission: RE | Admit: 2023-07-31 | Discharge: 2023-07-31 | Disposition: A | Discharge status: Home / Self Care | Payer: BC Managed Care – PPO | Source: Ambulatory Visit | Attending: Radiation Oncology

## 2023-07-31 ENCOUNTER — Ambulatory Visit: Payer: BC Managed Care – PPO | Admitting: Radiation Oncology

## 2023-07-31 ENCOUNTER — Ambulatory Visit
Admission: RE | Admit: 2023-07-31 | Discharge: 2023-07-31 | Disposition: A | Discharge status: Home / Self Care | Payer: BC Managed Care – PPO | Source: Ambulatory Visit | Attending: Radiation Oncology | Admitting: Radiation Oncology

## 2023-07-31 ENCOUNTER — Other Ambulatory Visit: Payer: Self-pay

## 2023-07-31 DIAGNOSIS — Z170 Estrogen receptor positive status [ER+]: Secondary | ICD-10-CM | POA: Diagnosis not present

## 2023-07-31 DIAGNOSIS — C50412 Malignant neoplasm of upper-outer quadrant of left female breast: Secondary | ICD-10-CM | POA: Insufficient documentation

## 2023-07-31 DIAGNOSIS — Z510 Encounter for antineoplastic radiation therapy: Secondary | ICD-10-CM | POA: Diagnosis not present

## 2023-07-31 DIAGNOSIS — C50512 Malignant neoplasm of lower-outer quadrant of left female breast: Secondary | ICD-10-CM | POA: Diagnosis not present

## 2023-07-31 LAB — RAD ONC ARIA SESSION SUMMARY
Course Elapsed Days: 21
Plan Fractions Treated to Date: 14
Plan Prescribed Dose Per Fraction: 2.67 Gy
Plan Total Fractions Prescribed: 15
Plan Total Prescribed Dose: 40.05 Gy
Reference Point Dosage Given to Date: 37.38 Gy
Reference Point Session Dosage Given: 2.67 Gy
Session Number: 14

## 2023-08-01 ENCOUNTER — Ambulatory Visit
Admission: RE | Admit: 2023-08-01 | Discharge: 2023-08-01 | Disposition: A | Discharge status: Home / Self Care | Payer: BC Managed Care – PPO | Source: Ambulatory Visit | Attending: Radiation Oncology

## 2023-08-01 ENCOUNTER — Other Ambulatory Visit: Payer: Self-pay

## 2023-08-01 DIAGNOSIS — Z170 Estrogen receptor positive status [ER+]: Secondary | ICD-10-CM | POA: Diagnosis not present

## 2023-08-01 DIAGNOSIS — Z510 Encounter for antineoplastic radiation therapy: Secondary | ICD-10-CM | POA: Diagnosis not present

## 2023-08-01 DIAGNOSIS — C50412 Malignant neoplasm of upper-outer quadrant of left female breast: Secondary | ICD-10-CM | POA: Diagnosis not present

## 2023-08-01 DIAGNOSIS — C50512 Malignant neoplasm of lower-outer quadrant of left female breast: Secondary | ICD-10-CM | POA: Diagnosis not present

## 2023-08-01 LAB — RAD ONC ARIA SESSION SUMMARY
Course Elapsed Days: 22
Plan Fractions Treated to Date: 15
Plan Prescribed Dose Per Fraction: 2.67 Gy
Plan Total Fractions Prescribed: 15
Plan Total Prescribed Dose: 40.05 Gy
Reference Point Dosage Given to Date: 40.05 Gy
Reference Point Session Dosage Given: 2.67 Gy
Session Number: 15

## 2023-08-02 ENCOUNTER — Ambulatory Visit
Admission: RE | Admit: 2023-08-02 | Discharge: 2023-08-02 | Disposition: A | Discharge status: Home / Self Care | Payer: BC Managed Care – PPO | Source: Ambulatory Visit | Attending: Radiation Oncology

## 2023-08-02 ENCOUNTER — Other Ambulatory Visit: Payer: Self-pay

## 2023-08-02 DIAGNOSIS — C50512 Malignant neoplasm of lower-outer quadrant of left female breast: Secondary | ICD-10-CM | POA: Diagnosis not present

## 2023-08-02 DIAGNOSIS — C50412 Malignant neoplasm of upper-outer quadrant of left female breast: Secondary | ICD-10-CM | POA: Diagnosis not present

## 2023-08-02 DIAGNOSIS — Z510 Encounter for antineoplastic radiation therapy: Secondary | ICD-10-CM | POA: Diagnosis not present

## 2023-08-02 DIAGNOSIS — Z170 Estrogen receptor positive status [ER+]: Secondary | ICD-10-CM | POA: Diagnosis not present

## 2023-08-02 LAB — RAD ONC ARIA SESSION SUMMARY
Course Elapsed Days: 23
Plan Fractions Treated to Date: 1
Plan Prescribed Dose Per Fraction: 2 Gy
Plan Total Fractions Prescribed: 5
Plan Total Prescribed Dose: 10 Gy
Reference Point Dosage Given to Date: 2 Gy
Reference Point Session Dosage Given: 2 Gy
Session Number: 16

## 2023-08-03 ENCOUNTER — Other Ambulatory Visit: Payer: Self-pay

## 2023-08-03 ENCOUNTER — Ambulatory Visit
Admission: RE | Admit: 2023-08-03 | Discharge: 2023-08-03 | Disposition: A | Discharge status: Home / Self Care | Payer: BC Managed Care – PPO | Source: Ambulatory Visit | Attending: Radiation Oncology | Admitting: Radiation Oncology

## 2023-08-03 DIAGNOSIS — C50412 Malignant neoplasm of upper-outer quadrant of left female breast: Secondary | ICD-10-CM | POA: Diagnosis not present

## 2023-08-03 DIAGNOSIS — Z170 Estrogen receptor positive status [ER+]: Secondary | ICD-10-CM | POA: Diagnosis not present

## 2023-08-03 LAB — RAD ONC ARIA SESSION SUMMARY
Course Elapsed Days: 24
Plan Fractions Treated to Date: 2
Plan Prescribed Dose Per Fraction: 2 Gy
Plan Total Fractions Prescribed: 5
Plan Total Prescribed Dose: 10 Gy
Reference Point Dosage Given to Date: 4 Gy
Reference Point Session Dosage Given: 2 Gy
Session Number: 17

## 2023-08-04 ENCOUNTER — Ambulatory Visit
Admission: RE | Admit: 2023-08-04 | Discharge: 2023-08-04 | Disposition: A | Discharge status: Home / Self Care | Payer: BC Managed Care – PPO | Source: Ambulatory Visit | Attending: Radiation Oncology | Admitting: Radiation Oncology

## 2023-08-04 ENCOUNTER — Other Ambulatory Visit: Payer: Self-pay

## 2023-08-04 DIAGNOSIS — C50412 Malignant neoplasm of upper-outer quadrant of left female breast: Secondary | ICD-10-CM | POA: Diagnosis not present

## 2023-08-04 DIAGNOSIS — Z170 Estrogen receptor positive status [ER+]: Secondary | ICD-10-CM | POA: Diagnosis not present

## 2023-08-04 LAB — RAD ONC ARIA SESSION SUMMARY
Course Elapsed Days: 25
Plan Fractions Treated to Date: 3
Plan Prescribed Dose Per Fraction: 2 Gy
Plan Total Fractions Prescribed: 5
Plan Total Prescribed Dose: 10 Gy
Reference Point Dosage Given to Date: 6 Gy
Reference Point Session Dosage Given: 2 Gy
Session Number: 18

## 2023-08-07 ENCOUNTER — Ambulatory Visit: Payer: BC Managed Care – PPO

## 2023-08-07 ENCOUNTER — Other Ambulatory Visit: Payer: Self-pay

## 2023-08-07 ENCOUNTER — Ambulatory Visit
Admission: RE | Admit: 2023-08-07 | Discharge: 2023-08-07 | Disposition: A | Discharge status: Home / Self Care | Payer: BC Managed Care – PPO | Source: Ambulatory Visit | Attending: Radiation Oncology | Admitting: Radiation Oncology

## 2023-08-07 DIAGNOSIS — Z170 Estrogen receptor positive status [ER+]: Secondary | ICD-10-CM | POA: Diagnosis not present

## 2023-08-07 DIAGNOSIS — C50412 Malignant neoplasm of upper-outer quadrant of left female breast: Secondary | ICD-10-CM | POA: Diagnosis not present

## 2023-08-07 LAB — RAD ONC ARIA SESSION SUMMARY
Course Elapsed Days: 28
Plan Fractions Treated to Date: 4
Plan Prescribed Dose Per Fraction: 2 Gy
Plan Total Fractions Prescribed: 5
Plan Total Prescribed Dose: 10 Gy
Reference Point Dosage Given to Date: 8 Gy
Reference Point Session Dosage Given: 2 Gy
Session Number: 19

## 2023-08-08 ENCOUNTER — Ambulatory Visit
Admission: RE | Admit: 2023-08-08 | Discharge: 2023-08-08 | Disposition: A | Discharge status: Home / Self Care | Payer: BC Managed Care – PPO | Source: Ambulatory Visit | Attending: Radiation Oncology

## 2023-08-08 ENCOUNTER — Other Ambulatory Visit: Payer: Self-pay

## 2023-08-08 DIAGNOSIS — Z510 Encounter for antineoplastic radiation therapy: Secondary | ICD-10-CM | POA: Diagnosis not present

## 2023-08-08 DIAGNOSIS — C50412 Malignant neoplasm of upper-outer quadrant of left female breast: Secondary | ICD-10-CM | POA: Diagnosis not present

## 2023-08-08 DIAGNOSIS — C50512 Malignant neoplasm of lower-outer quadrant of left female breast: Secondary | ICD-10-CM | POA: Diagnosis not present

## 2023-08-08 DIAGNOSIS — Z170 Estrogen receptor positive status [ER+]: Secondary | ICD-10-CM | POA: Diagnosis not present

## 2023-08-08 LAB — RAD ONC ARIA SESSION SUMMARY
Course Elapsed Days: 29
Plan Fractions Treated to Date: 5
Plan Prescribed Dose Per Fraction: 2 Gy
Plan Total Fractions Prescribed: 5
Plan Total Prescribed Dose: 10 Gy
Reference Point Dosage Given to Date: 10 Gy
Reference Point Session Dosage Given: 2 Gy
Session Number: 20

## 2023-08-09 NOTE — Radiation Completion Notes (Signed)
Patient Name: Angelica Holt, Angelica Holt MRN: [id number] Date of Birth: 1970-10-30 Referring Physician: Tonita Cong, M.D. Date of Service: 2023-08-09 Radiation Oncologist: Lonie Peak, M.D. [facility] [place]                             RADIATION ONCOLOGY END OF TREATMENT NOTE     Diagnosis: C50.512 Malignant neoplasm of lower-outer quadrant of left female breast Intent: Curative     ==========DELIVERED PLANS==========  First Treatment Date: 2023-07-10 Last Treatment Date: 2023-08-08   Plan Name: Breast_L_BH Site: Breast, Left Technique: 3D Mode: Photon Dose Per Fraction: 2.67 Gy Prescribed Dose (Delivered / Prescribed): 40.05 Gy / 40.05 Gy Prescribed Fxs (Delivered / Prescribed): 15 / 15   Plan Name: Brst_L_Bst_BH Site: Breast, Left Technique: 3D Mode: Photon Dose Per Fraction: 2 Gy Prescribed Dose (Delivered / Prescribed): 10 Gy / 10 Gy Prescribed Fxs (Delivered / Prescribed): 5 / 5     ==========ON TREATMENT VISIT DATES========== 2023-07-10, 2023-07-17, 2023-07-24, 2023-07-31, 2023-08-07     ==========UPCOMING VISITS========== 09/14/2023 [facility]-RADIATION ONC FOLLOW UP 20 Lonie Peak, MD        ==========APPENDIX - ON TREATMENT VISIT NOTES==========   See weekly On Treatment Notes in Epic for details in the Media tab (listed as Progress notes on the On Treatment Visit Dates listed above).

## 2023-09-06 DIAGNOSIS — Z803 Family history of malignant neoplasm of breast: Secondary | ICD-10-CM | POA: Diagnosis not present

## 2023-09-06 DIAGNOSIS — Z1331 Encounter for screening for depression: Secondary | ICD-10-CM | POA: Diagnosis not present

## 2023-09-06 DIAGNOSIS — Z01419 Encounter for gynecological examination (general) (routine) without abnormal findings: Secondary | ICD-10-CM | POA: Diagnosis not present

## 2023-09-06 DIAGNOSIS — Z853 Personal history of malignant neoplasm of breast: Secondary | ICD-10-CM | POA: Diagnosis not present

## 2023-09-12 NOTE — Progress Notes (Signed)
Angelica Holt presents today for follow-up after completing radiation to her left breast on 08/08/2023.  Pain: Denies Skin: Denies ROM:  Denies  Lymphedema: Denies MedOnc F/U: No recent follow ups. Other issues of note: None  Pt reports Yes No Comments  Tamoxifen [x]  []    Letrozole []  [x]    Anastrazole []  [x]    Mammogram [x]  Date: 05/27/23 [] 

## 2023-09-14 ENCOUNTER — Ambulatory Visit
Admission: RE | Admit: 2023-09-14 | Discharge: 2023-09-14 | Disposition: A | Discharge status: Home / Self Care | Payer: BC Managed Care – PPO | Source: Ambulatory Visit | Attending: Radiation Oncology | Admitting: Radiation Oncology

## 2023-09-14 ENCOUNTER — Encounter: Payer: Self-pay | Admitting: Radiation Oncology

## 2023-09-14 DIAGNOSIS — C50512 Malignant neoplasm of lower-outer quadrant of left female breast: Secondary | ICD-10-CM

## 2023-10-04 DIAGNOSIS — D508 Other iron deficiency anemias: Secondary | ICD-10-CM | POA: Diagnosis not present

## 2023-10-04 DIAGNOSIS — R948 Abnormal results of function studies of other organs and systems: Secondary | ICD-10-CM | POA: Diagnosis not present

## 2023-10-04 DIAGNOSIS — N951 Menopausal and female climacteric states: Secondary | ICD-10-CM | POA: Diagnosis not present

## 2023-10-04 DIAGNOSIS — E785 Hyperlipidemia, unspecified: Secondary | ICD-10-CM | POA: Diagnosis not present

## 2023-10-04 DIAGNOSIS — R7309 Other abnormal glucose: Secondary | ICD-10-CM | POA: Diagnosis not present

## 2023-10-24 DIAGNOSIS — D508 Other iron deficiency anemias: Secondary | ICD-10-CM | POA: Diagnosis not present

## 2023-10-24 DIAGNOSIS — R7309 Other abnormal glucose: Secondary | ICD-10-CM | POA: Diagnosis not present

## 2023-10-24 DIAGNOSIS — E559 Vitamin D deficiency, unspecified: Secondary | ICD-10-CM | POA: Diagnosis not present

## 2023-10-24 DIAGNOSIS — E785 Hyperlipidemia, unspecified: Secondary | ICD-10-CM | POA: Diagnosis not present

## 2023-11-20 DIAGNOSIS — N951 Menopausal and female climacteric states: Secondary | ICD-10-CM | POA: Diagnosis not present

## 2023-11-20 DIAGNOSIS — R7309 Other abnormal glucose: Secondary | ICD-10-CM | POA: Diagnosis not present

## 2023-11-20 DIAGNOSIS — D508 Other iron deficiency anemias: Secondary | ICD-10-CM | POA: Diagnosis not present

## 2023-11-20 DIAGNOSIS — R948 Abnormal results of function studies of other organs and systems: Secondary | ICD-10-CM | POA: Diagnosis not present

## 2023-12-05 DIAGNOSIS — Z170 Estrogen receptor positive status [ER+]: Secondary | ICD-10-CM | POA: Diagnosis not present

## 2023-12-05 DIAGNOSIS — C50512 Malignant neoplasm of lower-outer quadrant of left female breast: Secondary | ICD-10-CM | POA: Diagnosis not present

## 2023-12-05 NOTE — Progress Notes (Signed)
 ONCOLOGY PROGRESS NOTE  PATIENT NAME: Angelica Holt MRN#: [id number] DOB: Mar 24, 1971  Age: 53 y.o.   DATE: 12/05/2023  ONCOLOGY DIAGNOSIS: Stage IA left Invasive ductal carcinoma: pT1bN0M0, Grade 1, ER 90%, PR 99%, HER2 1+   ONCOLOGY TREATMENT HISTORY: 04/25/2023 outside hospital mammogram = 4 mm left breast lesion 04/26/2023 left breast ultrasound = left outer quadrant 4 mm mass.  Axilla negative 05/08/2023 left breast biopsy = invasive ductal carcinoma, grade 1, ER 90%, PR 99%, HER2 1+ 05/29/2023 left breast lumpectomy with Dr. Adrianne = pT1bN0M0, 6mm primary, 0/1 LN involved, G1, no LVI, margins negative. Oncotype DX = RS 20 = 6% risk of distant recurrence at 9 years with the use of endocrine therapy alone = < 1% chemo benefit 06/20/2023 started adjuvant endocrine therapy with tamoxifen 07/10/2023 - 08/08/2023 adjuvant radiation therapy with Dr. Izell @ [facility]: 50 [place] in 20 fractions   SUBJECTIVE: Angelica Holt is a 53 y.o. female who presents for follow up.   [name] was last seen 06/20/2023.  She is on tamoxifen.  She reports doing fair on it.  She has had hot flashes.  She has chronic arthropathies and overall they are stable.  She denies any change in her mood.  She reports that she takes Wellbutrin every day.  It was started by primary care provider for perimenopausal mood changes.  She denies any chest pain or shortness of breath.  Once in a while she has discomfort from the left breast.  She denies any nausea, vomiting,  diarrhea or constipation.  She denies any bleeding no melena, hematochezia, or hematuria.  She reports that she has not had menses since she started tamoxifen.  ALLERGIES:  Allergies as of 12/05/2023 - Review Complete 06/20/2023  Allergen Reaction Noted  . Penicillins Rash and Other 09/13/2011    MEDICATIONS:   Current Outpatient Medications  Medication Sig Dispense Refill  . buPROPion hcl (WELLBUTRIN XL) 300 mg 24 hr tablet Take  one tablet (300 mg dose) by mouth every morning.    SABRA FERROUS SULFATE 325 (65 FE) MG tablet Take one tablet (325 mg dose) by mouth with breakfast. Three times a week    . FIBER ADULT GUMMIES 2 g CHEW Chew 1 tablet by mouth daily.    . MULTIPLE VITAMINS PO Take 1 tablet by mouth daily.    . phentermine 37.5 MG capsule Take one capsule (37.5 mg dose) by mouth every morning.    . tamoxifen (NOLVADEX) 20 MG tablet Take one tablet (20 mg dose) by mouth daily. 30 tablet 0   No current facility-administered medications for this visit.    REVIEW OF SYSTEMS: As per HPI above  PAST MEDICAL HISTORY:  No past medical history on file.  SURGICAL HISTORY: Past Surgical History:  Procedure Laterality Date  . Breast biopsy Left 05/08/2023   left breast UOQ sono bx (tophat clip) Dr Pernell  . Wisdom tooth extraction       FAMILY HISTORY:   Family History  Problem Relation Age of Onset  . Alzheimer's disease Mother   . Heart disease Father   . Heart disease Sister   . Hyperlipidemia Brother   . No Known Problems Brother   . No Known Problems Daughter   . No Known Problems Son   . Melanoma Maternal Grandfather   . Breast cancer Paternal Grandmother 33     SOCIAL HISTORY:  Social History   Tobacco Use  . Smoking status: Never    Passive exposure: Never  .  Smokeless tobacco: Never  Substance Use Topics  . Alcohol use: Yes    Alcohol/week: 2.0 standard drinks of alcohol    Types: 2 Glasses of wine per week    Comment: Not every week - only socially    PHYSICAL EXAM:    BP 116/67 (BP Location: Left Upper Arm, Patient Position: Sitting)   Pulse 80   Temp 97.5 F (36.4 C) (Temporal)   Resp 16   Ht 5' 5 (1.651 m)   Wt 187 lb (84.8 kg)   SpO2 100%   BMI 31.12 kg/m   ECOG: 0  GENERAL: well nourished, well hydrated in no acute distress.  HEENT: moist mucus membrane NECK: supple, no masses, trachea midline, no thyromegaly.  RESPIRATORY:  no intercostal retractions or use of  accessory muscles. Lungs clear to auscultation bilaterally, no rales, rhonchi, or wheezes.  CARDIOVASCULAR: Regular rate and rhythm,normal S1 and S2, no murmur GASTROINTESTINAL: Abdomen soft, non-tender, non distended, active bowel sounds LYMPHATIC: no cervical or supraclavicular adenopathy.  MUSCULOSKELETAL: No LE edema, no deformities NEURO: no focal deficit, moves all four extremities, alert and oriented PSYCH: appropriate mood and affect DERM: skin warm and dry, no rash on exposed skin  BREAST: deferred   LABS: Recent Results (from the past 72 hours)  CBC And Differential   Collection Time: 12/05/23 12:08 PM  Result Value Ref Range   WBC 3.8 3.7 - 11.0 thou/mcL   RBC 4.62 4.01 - 4.90 million/mcL   HGB 14.2 12.2 - 14.9 gm/dL   HCT 57.8 64.1 - 52.0 %   MCV 91.1 82.0 - 98.0 fL   MCH 30.7 27.0 - 33.0 pg   MCHC 33.7 31.0 - 37.0 gm/dL   Plt Ct 814 849 - 599 thou/mcL   RDW SD 39.7 36.0 - 47.0 fL   RDW CV 11.7 (L) 11.8 - 14.9 %   NEUTROPHIL % 60.0 %   LYMPHOCYTE % 29.2 %   MONOCYTE % 8.2 %   Eosinophil % 2.1 %   BASOPHIL % 0.5 %   ABSOLUTE NEUTROPHIL COUNT 2.26 1.50 - 7.50 thou/mcL   ABSOLUTE LYMPHOCYTE COUNT 1.10 1.00 - 4.50 thou/mcL   Absolute Monocyte Count 0.31 0.10 - 0.80 thou/mcL   Absolute Eosinophil Count 0.08 0.00 - 0.50 thou/mcL   Absolute Basophil Count 0.02 0.00 - 0.20 thou/mcL     No results found for this or any previous visit (from the past 72 hours).    RADIOLOGY:  No results found.   PATHOLOGY   05/29/2023 Final Diagnosis  1.  Left breast, excision:       -Invasive carcinoma of no special type (ductal, NOS), grade I of III, 0.6 cm in greatest dimension. See template.       -Ductal carcinoma in situ (DCIS), grade II, cribriform type.       -Surgical resection margins, negative for invasive carcinoma and DCIS.       -Biopsy site changes.       -Pathologic stage: pT1b pN0(sn), AJCC stage IA   2.  Left sentinel lymph node, excision:       -One  lymph node, negative for carcinoma (0/1).  Electronically signed by Damien KATHEE Found, MD on 05/31/2023 at [phone]  Synoptic Report  INVASIVE CARCINOMA OF THE BREAST: Resection  8th Edition - Protocol posted: 12/13/2023INVASIVE CARCINOMA OF THE BREAST: RESECTION - All Specimens SPECIMEN  Procedure  Excision (less than total mastectomy)  Specimen Laterality  Left  TUMOR  Tumor Site  Not specified  Histologic  Type  Invasive carcinoma of no special type (ductal)  Histologic Grade (Nottingham Histologic Score)    Glandular (Acinar) / Tubular Differentiation  Score 2  Nuclear Pleomorphism  Score 2  Mitotic Rate  Score 1  Overall Grade  Grade 1 (scores of 3, 4 or 5)  Tumor Size  Greatest dimension of largest invasive focus (Millimeters): 6 mm  Ductal Carcinoma In Situ (DCIS)  Present    Negative for extensive intraductal component (EIC)  Architectural Patterns  Cribriform  Nuclear Grade  Grade II (intermediate)  Necrosis  Not identified  Lobular Carcinoma In Situ (LCIS)  Not identified  Lymphatic and / or Vascular Invasion  Not identified  Dermal Lymphatic and / or Vascular Invasion  No skin present  Microcalcifications  Not identified  Treatment Effect in the Breast  No known presurgical therapy  MARGINS  Margin Status for Invasive Carcinoma  All margins negative for invasive carcinoma  Distance from Invasive Carcinoma to Closest Margin  5 mm  Closest Margin(s) to Invasive Carcinoma  Anterior    Superior  Margin Status for DCIS  All margins negative for DCIS  Distance from DCIS to Closest Margin  5 mm  REGIONAL LYMPH NODES  Regional Lymph Node Status  All regional lymph nodes negative for tumor  Total Number of Lymph Nodes Examined (sentinel and non-sentinel)  1  Number of Sentinel Nodes Examined  1  pTNM CLASSIFICATION (AJCC 8th Edition)  Reporting of pT, pN, and (when applicable) pM categories is based on information available to the pathologist at the time the report is issued. As per  the AJCC (Chapter 1, 8th Ed.) it is the managing physician's responsibility to establish the final pathologic stage based upon all pertinent information, including but potentially not limited to this pathology report.  pT Category  pT1b  pN Category  pN0  N Suffix  (sn)  ADDITIONAL FINDINGS  Additional Findings  see final diagnosis  SPECIAL STUDIES      Estrogen Receptor (ER) Status  Positive (greater than 10% of cells demonstrate nuclear positivity)  Percentage of Cells with Nuclear Positivity  90 %      Progesterone Receptor (PgR) Status  Positive  Percentage of Cells with Nuclear Positivity  99 %      HER2 (by immunohistochemistry)  Negative (Score 1+)  Testing Performed on Case Number  [id number]        IMPRESSION / PLAN:   Rifky Lapre is a 53 y.o. female who presents for follow up.   Stage IA left Invasive ductal carcinoma: pT1bN0M0, grade 1, ER 90%, PR 99%, HER2 1+   The goal of treatment is cure.  She is status post lumpectomy + SLN with Dr. Adrianne.  She was noted to have a 6 mm primary.  It is grade 1.  One lymph node was removed and it was negative. There was no LVI.  The margins were negative.  Between 07/10/2023 and 08/08/2023 she received adjuvant radiation therapy with Dr. Izell @ [facility]: 50 [place] in 20 fractions  She started tamoxifen 06/20/2023. She was premenopausal at the time of diagnosis.  The plan is to treat her for 5 years.  - She is on tamoxifen and tolerating this medication well. - Today (12/05/2023), I had a long discussion with Ms. Limones.  I told her that we fortunately that there is a drug to drug interaction between Tamoxifen and Wellbutrin that was missed when the Tamoxifen was started.  We discussed the fact that we can continue  tamoxifen and switch to a different antidepressant that does not interact with tamoxifen. If she prefers to continue Wellbutrin then we will need to switch her to an aromatase inhibitor with ovarian suppression  then stop the Tamoxifen.  She prefers to stop the Wellbutrin.  We discussed the fact that the Wellbutrin needs to be tapered.  I recommended the decreasing the dose to 150 mg/half a tablet once a day for the 1 week and then 150mg  /half a tablet every other day for 1 week.  We discussed the fact that she might have symptoms without this medication.  If needed we can switch her to a different antidepressant Effexor or Lexapro.  She will let me know how her mood is without the Wellbutrin. We will continue tamoxifen and plan to taper off Wellbutrin. She will follow-up in 3 months  Long-term current use of tamoxifen I reviewed today CBC.  It is essentially normal A CMP has been ordered and it is pending We will monitor  Obesity BMI noted to be 31.12 We will monitor   Orders Placed This Encounter  Procedures  . Mammo 3D Tomo Diagnostic Bilateral  . CBC And Differential  . Comprehensive Metabolic Panel  . CBC And Differential  . Comprehensive Metabolic Panel    Romana RAYMOND Quay, MD 12/05/2023 / 6:16 PM

## 2023-12-08 NOTE — Progress Notes (Signed)
 Mailed navigator letter and pin for completion of active treatment.  Metta Core, MSN, RN, [facility] Breast Oncology Nurse Navigator 12/08/2023 / 3:16 PM

## 2024-01-24 DIAGNOSIS — D508 Other iron deficiency anemias: Secondary | ICD-10-CM | POA: Diagnosis not present

## 2024-01-24 DIAGNOSIS — R948 Abnormal results of function studies of other organs and systems: Secondary | ICD-10-CM | POA: Diagnosis not present

## 2024-01-24 DIAGNOSIS — N951 Menopausal and female climacteric states: Secondary | ICD-10-CM | POA: Diagnosis not present

## 2024-01-24 DIAGNOSIS — E559 Vitamin D deficiency, unspecified: Secondary | ICD-10-CM | POA: Diagnosis not present

## 2024-03-06 DIAGNOSIS — Z79810 Long term (current) use of selective estrogen receptor modulators (SERMs): Secondary | ICD-10-CM | POA: Diagnosis not present

## 2024-03-06 DIAGNOSIS — Z170 Estrogen receptor positive status [ER+]: Secondary | ICD-10-CM | POA: Diagnosis not present

## 2024-03-06 DIAGNOSIS — Z79899 Other long term (current) drug therapy: Secondary | ICD-10-CM | POA: Diagnosis not present

## 2024-03-06 DIAGNOSIS — C50512 Malignant neoplasm of lower-outer quadrant of left female breast: Secondary | ICD-10-CM | POA: Diagnosis not present

## 2024-05-13 DIAGNOSIS — R948 Abnormal results of function studies of other organs and systems: Secondary | ICD-10-CM | POA: Diagnosis not present

## 2024-05-13 DIAGNOSIS — D508 Other iron deficiency anemias: Secondary | ICD-10-CM | POA: Diagnosis not present

## 2024-05-13 DIAGNOSIS — E559 Vitamin D deficiency, unspecified: Secondary | ICD-10-CM | POA: Diagnosis not present

## 2024-05-13 DIAGNOSIS — N951 Menopausal and female climacteric states: Secondary | ICD-10-CM | POA: Diagnosis not present

## 2024-05-19 DIAGNOSIS — H524 Presbyopia: Secondary | ICD-10-CM | POA: Diagnosis not present

## 2024-05-22 DIAGNOSIS — R92313 Mammographic fatty tissue density, bilateral breasts: Secondary | ICD-10-CM | POA: Diagnosis not present

## 2024-05-22 DIAGNOSIS — Z923 Personal history of irradiation: Secondary | ICD-10-CM | POA: Diagnosis not present

## 2024-05-22 DIAGNOSIS — R922 Inconclusive mammogram: Secondary | ICD-10-CM | POA: Diagnosis not present

## 2024-05-22 DIAGNOSIS — Z853 Personal history of malignant neoplasm of breast: Secondary | ICD-10-CM | POA: Diagnosis not present

## 2024-05-22 DIAGNOSIS — C50912 Malignant neoplasm of unspecified site of left female breast: Secondary | ICD-10-CM | POA: Diagnosis not present

## 2024-05-22 DIAGNOSIS — Z08 Encounter for follow-up examination after completed treatment for malignant neoplasm: Secondary | ICD-10-CM | POA: Diagnosis not present

## 2024-05-29 DIAGNOSIS — F419 Anxiety disorder, unspecified: Secondary | ICD-10-CM | POA: Diagnosis not present

## 2024-05-29 DIAGNOSIS — F32A Depression, unspecified: Secondary | ICD-10-CM | POA: Diagnosis not present

## 2024-06-12 DIAGNOSIS — R300 Dysuria: Secondary | ICD-10-CM | POA: Diagnosis not present

## 2024-06-12 DIAGNOSIS — R399 Unspecified symptoms and signs involving the genitourinary system: Secondary | ICD-10-CM | POA: Diagnosis not present

## 2024-06-12 DIAGNOSIS — R350 Frequency of micturition: Secondary | ICD-10-CM | POA: Diagnosis not present

## 2024-06-24 DIAGNOSIS — R053 Chronic cough: Secondary | ICD-10-CM | POA: Diagnosis not present

## 2024-06-24 DIAGNOSIS — R399 Unspecified symptoms and signs involving the genitourinary system: Secondary | ICD-10-CM | POA: Diagnosis not present

## 2024-06-24 DIAGNOSIS — B029 Zoster without complications: Secondary | ICD-10-CM | POA: Diagnosis not present

## 2024-06-24 DIAGNOSIS — R509 Fever, unspecified: Secondary | ICD-10-CM | POA: Diagnosis not present

## 2024-06-24 DIAGNOSIS — S299XXA Unspecified injury of thorax, initial encounter: Secondary | ICD-10-CM | POA: Diagnosis not present

## 2024-07-17 DIAGNOSIS — E559 Vitamin D deficiency, unspecified: Secondary | ICD-10-CM | POA: Diagnosis not present

## 2024-07-17 DIAGNOSIS — N951 Menopausal and female climacteric states: Secondary | ICD-10-CM | POA: Diagnosis not present

## 2024-07-17 DIAGNOSIS — R948 Abnormal results of function studies of other organs and systems: Secondary | ICD-10-CM | POA: Diagnosis not present

## 2024-07-17 DIAGNOSIS — D508 Other iron deficiency anemias: Secondary | ICD-10-CM | POA: Diagnosis not present

## 2024-08-02 ENCOUNTER — Telehealth: Payer: Self-pay

## 2024-08-02 NOTE — Telephone Encounter (Signed)
 12/5 Received voicemail from this patient to be sch for appt with Dr. Loretha.  Email forward to [employer] and copied Alyssa, so they are aware.

## 2024-08-05 DIAGNOSIS — F32A Depression, unspecified: Secondary | ICD-10-CM | POA: Diagnosis not present

## 2024-08-05 DIAGNOSIS — F419 Anxiety disorder, unspecified: Secondary | ICD-10-CM | POA: Diagnosis not present
